# Patient Record
Sex: Male | Born: 1980 | Race: Black or African American | Hispanic: No | Marital: Single | Smoking: Current every day smoker
Health system: Southern US, Community
[De-identification: ages and names within clinical notes are randomized; demographics above are authoritative.]

## PROBLEM LIST (undated history)

## (undated) DIAGNOSIS — R569 Unspecified convulsions: Secondary | ICD-10-CM

## (undated) DIAGNOSIS — N289 Disorder of kidney and ureter, unspecified: Secondary | ICD-10-CM

## (undated) DIAGNOSIS — I10 Essential (primary) hypertension: Secondary | ICD-10-CM

---

## 2005-05-19 ENCOUNTER — Emergency Department (HOSPITAL_COMMUNITY): Admission: EM | Admit: 2005-05-19 | Discharge: 2005-05-19 | Payer: Self-pay | Admitting: Family Medicine

## 2005-09-22 ENCOUNTER — Emergency Department (HOSPITAL_COMMUNITY): Admission: EM | Admit: 2005-09-22 | Discharge: 2005-09-22 | Payer: Self-pay | Admitting: Emergency Medicine

## 2007-04-05 ENCOUNTER — Inpatient Hospital Stay (HOSPITAL_COMMUNITY): Admission: AC | Admit: 2007-04-05 | Discharge: 2007-04-06 | Payer: Self-pay

## 2007-04-21 ENCOUNTER — Ambulatory Visit: Payer: Self-pay | Admitting: Internal Medicine

## 2007-05-05 ENCOUNTER — Ambulatory Visit: Payer: Self-pay | Admitting: Internal Medicine

## 2007-05-08 ENCOUNTER — Ambulatory Visit: Payer: Self-pay | Admitting: *Deleted

## 2007-05-23 ENCOUNTER — Emergency Department (HOSPITAL_COMMUNITY): Admission: EM | Admit: 2007-05-23 | Discharge: 2007-05-24 | Payer: Self-pay | Admitting: Emergency Medicine

## 2007-08-11 ENCOUNTER — Emergency Department (HOSPITAL_COMMUNITY): Admission: EM | Admit: 2007-08-11 | Discharge: 2007-08-11 | Payer: Self-pay | Admitting: Family Medicine

## 2007-11-05 ENCOUNTER — Emergency Department (HOSPITAL_COMMUNITY): Admission: EM | Admit: 2007-11-05 | Discharge: 2007-11-05 | Payer: Self-pay | Admitting: Emergency Medicine

## 2007-11-09 ENCOUNTER — Ambulatory Visit: Payer: Self-pay | Admitting: Internal Medicine

## 2007-11-14 ENCOUNTER — Emergency Department (HOSPITAL_COMMUNITY): Admission: EM | Admit: 2007-11-14 | Discharge: 2007-11-14 | Payer: Self-pay | Admitting: Emergency Medicine

## 2008-01-19 ENCOUNTER — Ambulatory Visit: Payer: Self-pay | Admitting: Internal Medicine

## 2008-01-20 ENCOUNTER — Emergency Department (HOSPITAL_COMMUNITY): Admission: EM | Admit: 2008-01-20 | Discharge: 2008-01-20 | Payer: Self-pay | Admitting: Family Medicine

## 2008-04-08 ENCOUNTER — Ambulatory Visit: Payer: Self-pay | Admitting: Internal Medicine

## 2008-04-30 ENCOUNTER — Emergency Department (HOSPITAL_COMMUNITY): Admission: EM | Admit: 2008-04-30 | Discharge: 2008-04-30 | Payer: Self-pay | Admitting: Family Medicine
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
## 2008-05-14 ENCOUNTER — Emergency Department (HOSPITAL_COMMUNITY): Admission: EM | Admit: 2008-05-14 | Discharge: 2008-05-14 | Payer: Self-pay | Admitting: Emergency Medicine

## 2008-05-15 ENCOUNTER — Ambulatory Visit: Payer: Self-pay | Admitting: Family Medicine

## 2008-07-10 ENCOUNTER — Ambulatory Visit: Payer: Self-pay | Admitting: Internal Medicine

## 2008-08-29 ENCOUNTER — Emergency Department (HOSPITAL_COMMUNITY): Admission: EM | Admit: 2008-08-29 | Discharge: 2008-08-29 | Payer: Self-pay | Admitting: Emergency Medicine

## 2008-09-11 ENCOUNTER — Ambulatory Visit: Payer: Self-pay | Admitting: Internal Medicine

## 2009-07-22 ENCOUNTER — Emergency Department (HOSPITAL_COMMUNITY): Admission: EM | Admit: 2009-07-22 | Discharge: 2009-07-22 | Payer: Self-pay | Admitting: Family Medicine

## 2009-08-08 ENCOUNTER — Emergency Department (HOSPITAL_COMMUNITY): Admission: EM | Admit: 2009-08-08 | Discharge: 2009-08-08 | Payer: Self-pay | Admitting: Emergency Medicine

## 2009-10-20 ENCOUNTER — Ambulatory Visit: Payer: Self-pay | Admitting: Internal Medicine

## 2009-12-15 ENCOUNTER — Ambulatory Visit: Payer: Self-pay | Admitting: Internal Medicine

## 2010-09-06 ENCOUNTER — Emergency Department (HOSPITAL_COMMUNITY): Admission: EM | Admit: 2010-09-06 | Discharge: 2010-09-06 | Payer: Self-pay | Admitting: Emergency Medicine

## 2011-01-17 ENCOUNTER — Encounter: Payer: Self-pay | Admitting: Emergency Medicine

## 2011-01-17 ENCOUNTER — Emergency Department (HOSPITAL_COMMUNITY)
Admission: EM | Admit: 2011-01-17 | Discharge: 2011-01-18 | Payer: Self-pay | Source: Home / Self Care | Admitting: Emergency Medicine

## 2011-04-03 LAB — CBC
HCT: 42 % (ref 39.0–52.0)
Hemoglobin: 14.4 g/dL (ref 13.0–17.0)
MCV: 88.2 fL (ref 78.0–100.0)
Platelets: 167 10*3/uL (ref 150–400)
RDW: 14.4 % (ref 11.5–15.5)

## 2011-04-03 LAB — DIFFERENTIAL
Basophils Absolute: 0.1 10*3/uL (ref 0.0–0.1)
Basophils Relative: 1 % (ref 0–1)
Eosinophils Absolute: 0.3 10*3/uL (ref 0.0–0.7)
Eosinophils Relative: 3 % (ref 0–5)
Monocytes Relative: 11 % (ref 3–12)
Neutrophils Relative %: 62 % (ref 43–77)

## 2011-04-03 LAB — COMPREHENSIVE METABOLIC PANEL
ALT: 21 U/L (ref 0–53)
AST: 21 U/L (ref 0–37)
Albumin: 3.7 g/dL (ref 3.5–5.2)
CO2: 27 mEq/L (ref 19–32)
Chloride: 105 mEq/L (ref 96–112)
Total Bilirubin: 0.9 mg/dL (ref 0.3–1.2)
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
## 2011-09-22 LAB — COMPREHENSIVE METABOLIC PANEL
Albumin: 3.9
Calcium: 9.3
GFR calc Af Amer: >60
GFR calc non Af Amer: 57 — ABNORMAL LOW
Glucose, Bld: 106 — ABNORMAL HIGH
Sodium: 138
Total Protein: 7.3

## 2011-10-05 LAB — DIFFERENTIAL
Basophils Absolute: 0
Lymphocytes Relative: 26
Lymphs Abs: 1.8
Monocytes Relative: 9
Neutrophils Relative %: 60

## 2011-10-05 LAB — COMPREHENSIVE METABOLIC PANEL
ALT: 37
AST: 22
BUN: 4 — ABNORMAL LOW
CO2: 27
Chloride: 107
Creatinine, Ser: 1.34
GFR calc Af Amer: >60
GFR calc non Af Amer: >60
Glucose, Bld: 103 — ABNORMAL HIGH
Potassium: 3.8
Total Bilirubin: 0.7
Total Protein: 6.4

## 2011-10-05 LAB — CBC
HCT: 38.5 — ABNORMAL LOW
MCHC: 33.8
MCV: 86.2

## 2013-05-22 ENCOUNTER — Emergency Department (HOSPITAL_COMMUNITY)
Admission: EM | Admit: 2013-05-22 | Discharge: 2013-05-23 | Disposition: A | Discharge status: Home / Self Care | Payer: Self-pay | Attending: Emergency Medicine | Admitting: Emergency Medicine

## 2013-05-22 ENCOUNTER — Encounter (HOSPITAL_COMMUNITY): Payer: Self-pay | Admitting: *Deleted

## 2013-05-22 DIAGNOSIS — F17200 Nicotine dependence, unspecified, uncomplicated: Secondary | ICD-10-CM | POA: Insufficient documentation

## 2013-05-22 DIAGNOSIS — Z23 Encounter for immunization: Secondary | ICD-10-CM | POA: Insufficient documentation

## 2013-05-22 DIAGNOSIS — Y929 Unspecified place or not applicable: Secondary | ICD-10-CM | POA: Insufficient documentation

## 2013-05-22 DIAGNOSIS — Y9389 Activity, other specified: Secondary | ICD-10-CM | POA: Insufficient documentation

## 2013-05-22 DIAGNOSIS — S61409A Unspecified open wound of unspecified hand, initial encounter: Secondary | ICD-10-CM | POA: Insufficient documentation

## 2013-05-22 DIAGNOSIS — W540XXA Bitten by dog, initial encounter: Secondary | ICD-10-CM

## 2013-05-22 NOTE — ED Notes (Signed)
Pt states was walking down [address] and unknown dog came and bit his right hand; no other injury;

## 2013-05-23 ENCOUNTER — Emergency Department (HOSPITAL_COMMUNITY): Payer: Self-pay

## 2013-05-23 MED ORDER — AMOXICILLIN-POT CLAVULANATE 875-125 MG PO TABS
1.0000 | ORAL_TABLET | Freq: Once | ORAL | Status: AC
  Administered 2013-05-23: 1 via ORAL
  Filled 2013-05-23: qty 1

## 2013-05-23 MED ORDER — AMOXICILLIN-POT CLAVULANATE 875-125 MG PO TABS: 1.0000 | ORAL_TABLET | Freq: Two times a day (BID) | ORAL | Status: DC

## 2013-05-23 MED ORDER — AMOXICILLIN-POT CLAVULANATE 250-62.5 MG/5ML PO SUSR: 250.0000 mg | Freq: Three times a day (TID) | ORAL | Status: DC

## 2013-05-23 MED ORDER — HYDROCODONE-ACETAMINOPHEN 5-325 MG PO TABS: 2.0000 | ORAL_TABLET | Freq: Four times a day (QID) | ORAL | Status: DC | PRN

## 2013-05-23 MED ORDER — RABIES IMMUNE GLOBULIN 150 UNIT/ML IM INJ
20.0000 [IU]/kg | INJECTION | Freq: Once | INTRAMUSCULAR | Status: AC
  Administered 2013-05-23: 2325 [IU] via INTRAMUSCULAR
  Filled 2013-05-23: qty 15.5

## 2013-05-23 MED ORDER — TETANUS-DIPHTH-ACELL PERTUSSIS 5-2.5-18.5 LF-MCG/0.5 IM SUSP
0.5000 mL | Freq: Once | INTRAMUSCULAR | Status: AC
  Administered 2013-05-23: 0.5 mL via INTRAMUSCULAR
  Filled 2013-05-23: qty 0.5

## 2013-05-23 MED ORDER — RABIES VACCINE, PCEC IM SUSR
1.0000 mL | Freq: Once | INTRAMUSCULAR | Status: AC
  Administered 2013-05-23: 1 mL via INTRAMUSCULAR
  Filled 2013-05-23: qty 1

## 2013-05-23 NOTE — ED Provider Notes (Signed)
History     CSN: [id number]  Arrival date & time 05/22/13  2239   First MD Initiated Contact with Patient 05/22/13 2325      Chief Complaint  Patient presents with  . Animal Bite    (Consider location/radiation/quality/duration/timing/severity/associated sxs/prior treatment) HPI Comments: Patient presents emergency department with chief complaint of dog bite. States that the dog is unknown. He is uncertain of doxylamine status. The dog ran off after the bite. The dog bit his right hand. There is no other injury. Patient is complaining of pain and bleeding. There is a small laceration on the palmar aspect of his right hand. His tetanus status is unknown. He has not tried anything to alleviate his symptoms. The pain does not radiate. He denies any weakness in his hand or fingers.  The history is provided by the patient. No language interpreter was used.    History reviewed. No pertinent past medical history.  History reviewed. No pertinent past surgical history.  No family history on file.  History  Substance Use Topics  . Smoking status: Current Every Day Smoker -- 0.50 packs/day    Types: Cigarettes  . Smokeless tobacco: Not on file  . Alcohol Use: No      Review of Systems  All other systems reviewed and are negative.    Allergies  Review of patient's allergies indicates no known allergies.  Home Medications   Current Outpatient Rx  Name  Route  Sig  Dispense  Refill  . amoxicillin-clavulanate (AUGMENTIN) 875-125 MG per tablet   Oral   Take 1 tablet by mouth every 12 (twelve) hours.   14 tablet   0   . HYDROcodone-acetaminophen (NORCO/VICODIN) 5-325 MG per tablet   Oral   Take 2 tablets by mouth every 6 (six) hours as needed for pain.   13 tablet   0     BP 138/93  Pulse 75  Temp(Src) 98.4 F (36.9 C)  Resp 20  Ht 6' (1.829 m)  Wt 260 lb (117.935 kg)  BMI 35.25 kg/m2  SpO2 98%  Physical Exam  Nursing note and vitals reviewed. Constitutional:  He is oriented to person, place, and time. He appears well-developed and well-nourished.  HENT:  Head: Normocephalic and atraumatic.  Eyes: Conjunctivae and EOM are normal.  Neck: Normal range of motion.  Cardiovascular: Normal rate.   Pulmonary/Chest: Effort normal.  Abdominal: He exhibits no distension.  Musculoskeletal: Normal range of motion.  Right hand range of motion 5/5, strength 5/5, no tendon involvement  Neurological: He is alert and oriented to person, place, and time.  Skin: Skin is dry.  5 cm laceration to palmar aspect of right hand from a dog bite, no visible involvement of tendons, no foreign bodies  Psychiatric: He has a normal mood and affect. His behavior is normal. Judgment and thought content normal.    ED Course  Procedures (including critical care time)  Labs Reviewed - No data to display Dg Hand Complete Right  05/23/2013   *RADIOLOGY REPORT*  Clinical Data: Dog bite  RIGHT HAND - COMPLETE 3+ VIEW  Comparison: None.  Findings: No evidence of fracture of the carpal or metacarpal bones.  No foreign body.  IMPRESSION: No fracture or foreign body.   Original Report Authenticated By: Genevive Bi, M.D.   LACERATION REPAIR Performed by: Roxy Horseman Authorized by: Roxy Horseman Consent: Verbal consent obtained. Risks and benefits: risks, benefits and alternatives were discussed Consent given by: patient Patient identity confirmed: provided demographic data Prepped  and Draped in normal sterile fashion Wound explored  Laceration Location:right palm  Laceration Length: 4 cm  No Foreign Bodies seen or palpated  Anesthesia: local infiltration  Local anesthetic: lidocaine 2% without epinephrine  Anesthetic total: 5 ml  Irrigation method: syringe Amount of cleaning: standard  Skin closure: 4-0 nylon  Number of sutures: 2 loose  Technique: simple, loose  Patient tolerance: Patient tolerated the procedure well with no immediate  complications.   1. Dog bite, initial encounter       MDM  Patient with animal bite. He was bitten by a dog earlier today. Will update tetanus, and give rabies prophylaxis. Patient's laceration to right palm was closed with 2 very loose sutures, I will treat the patient with Augmentin and pain medicine. Followup with hand surgery tomorrow. The dog bite was copiously irrigated, no foreign bodies were visualized, no signs of tendon involvement. Patient was seen by and discussed with Dr. Adriana Simas, who agrees with the plan. Patient is stable and ready for discharge.  1:17 AM Patient seen by and discussed with Dr. Adriana Simas.       Roxy Horseman, PA-C 05/23/13 0126

## 2013-05-23 NOTE — ED Provider Notes (Signed)
Medical screening examination/treatment/procedure(s) were conducted as a shared visit with non-physician practitioner(s) and myself.  I personally evaluated the patient during the encounter.   Wound is irrigated copiously and closed very loosely by physician assistant.  Possibility of infection discussed. Antibiotics started.  Donnetta Hutching, MD 05/23/13 [phone]

## 2016-06-10 ENCOUNTER — Encounter (HOSPITAL_COMMUNITY): Payer: Self-pay | Admitting: *Deleted

## 2016-06-10 ENCOUNTER — Emergency Department (HOSPITAL_COMMUNITY)
Admission: EM | Admit: 2016-06-10 | Discharge: 2016-06-10 | Disposition: A | Discharge status: Home / Self Care | Payer: Self-pay | Attending: Emergency Medicine | Admitting: Emergency Medicine

## 2016-06-10 DIAGNOSIS — S01512A Laceration without foreign body of oral cavity, initial encounter: Secondary | ICD-10-CM | POA: Insufficient documentation

## 2016-06-10 DIAGNOSIS — Y929 Unspecified place or not applicable: Secondary | ICD-10-CM | POA: Insufficient documentation

## 2016-06-10 DIAGNOSIS — R569 Unspecified convulsions: Secondary | ICD-10-CM

## 2016-06-10 DIAGNOSIS — N189 Chronic kidney disease, unspecified: Secondary | ICD-10-CM | POA: Insufficient documentation

## 2016-06-10 DIAGNOSIS — G40909 Epilepsy, unspecified, not intractable, without status epilepticus: Secondary | ICD-10-CM | POA: Insufficient documentation

## 2016-06-10 DIAGNOSIS — I129 Hypertensive chronic kidney disease with stage 1 through stage 4 chronic kidney disease, or unspecified chronic kidney disease: Secondary | ICD-10-CM | POA: Insufficient documentation

## 2016-06-10 DIAGNOSIS — Z79899 Other long term (current) drug therapy: Secondary | ICD-10-CM | POA: Insufficient documentation

## 2016-06-10 DIAGNOSIS — X58XXXA Exposure to other specified factors, initial encounter: Secondary | ICD-10-CM | POA: Insufficient documentation

## 2016-06-10 DIAGNOSIS — F17210 Nicotine dependence, cigarettes, uncomplicated: Secondary | ICD-10-CM | POA: Insufficient documentation

## 2016-06-10 DIAGNOSIS — Y939 Activity, unspecified: Secondary | ICD-10-CM | POA: Insufficient documentation

## 2016-06-10 DIAGNOSIS — Y999 Unspecified external cause status: Secondary | ICD-10-CM | POA: Insufficient documentation

## 2016-06-10 HISTORY — DX: Disorder of kidney and ureter, unspecified: N28.9

## 2016-06-10 HISTORY — DX: Unspecified convulsions: R56.9

## 2016-06-10 HISTORY — DX: Essential (primary) hypertension: I10

## 2016-06-10 LAB — BASIC METABOLIC PANEL
ANION GAP: 6 (ref 5–15)
BUN: 12 mg/dL (ref 6–20)
CHLORIDE: 107 mmol/L (ref 101–111)
CO2: 23 mmol/L (ref 22–32)
Calcium: 8.8 mg/dL — ABNORMAL LOW (ref 8.9–10.3)
Creatinine, Ser: 1.49 mg/dL — ABNORMAL HIGH (ref 0.61–1.24)
GFR calc Af Amer: >60 mL/min (ref >60)
GFR, EST NON AFRICAN AMERICAN: 59 mL/min — AB (ref >60)
GLUCOSE: 117 mg/dL — AB (ref 65–99)
POTASSIUM: 4.6 mmol/L (ref 3.5–5.1)
Sodium: 136 mmol/L (ref 135–145)

## 2016-06-10 LAB — CBC WITH DIFFERENTIAL/PLATELET
BASOS PCT: 0 %
Basophils Absolute: 0 10*3/uL (ref 0.0–0.1)
EOS ABS: 0.1 10*3/uL (ref 0.0–0.7)
Eosinophils Relative: 1 %
HCT: 44.1 % (ref 39.0–52.0)
HEMOGLOBIN: 14.7 g/dL (ref 13.0–17.0)
Lymphocytes Relative: 13 %
Lymphs Abs: 1.6 10*3/uL (ref 0.7–4.0)
MCH: 29.1 pg (ref 26.0–34.0)
MCHC: 33.3 g/dL (ref 30.0–36.0)
MCV: 87.2 fL (ref 78.0–100.0)
Monocytes Absolute: 0.9 10*3/uL (ref 0.1–1.0)
Monocytes Relative: 8 %
NEUTROS ABS: 9.3 10*3/uL — AB (ref 1.7–7.7)
NEUTROS PCT: 78 %
PLATELETS: 208 10*3/uL (ref 150–400)
RBC: 5.06 MIL/uL (ref 4.22–5.81)
RDW: 14.2 % (ref 11.5–15.5)
WBC: 11.9 10*3/uL — AB (ref 4.0–10.5)

## 2016-06-10 LAB — CBG MONITORING, ED: Glucose-Capillary: 110 mg/dL — ABNORMAL HIGH (ref 65–99)

## 2016-06-10 MED ORDER — SODIUM CHLORIDE 0.9 % IV BOLUS (SEPSIS)
1000.0000 mL | Freq: Once | INTRAVENOUS | Status: AC
  Administered 2016-06-10: 1000 mL via INTRAVENOUS

## 2016-06-10 MED ORDER — SODIUM CHLORIDE 0.9 % IV BOLUS (SEPSIS): 1000.0000 mL | Freq: Once | INTRAVENOUS | Status: DC

## 2016-06-10 MED ORDER — ACETAMINOPHEN 500 MG PO TABS
1000.0000 mg | ORAL_TABLET | Freq: Once | ORAL | Status: AC
  Administered 2016-06-10: 1000 mg via ORAL
  Filled 2016-06-10: qty 2

## 2016-06-10 MED ORDER — TETANUS-DIPHTH-ACELL PERTUSSIS 5-2.5-18.5 LF-MCG/0.5 IM SUSP
0.5000 mL | Freq: Once | INTRAMUSCULAR | Status: DC
  Filled 2016-06-10: qty 0.5

## 2016-06-10 MED ORDER — LEVETIRACETAM 500 MG/5ML IV SOLN
1000.0000 mg | Freq: Once | INTRAVENOUS | Status: AC
  Administered 2016-06-10: 1000 mg via INTRAVENOUS
  Filled 2016-06-10: qty 10

## 2016-06-10 MED ORDER — CHLORHEXIDINE GLUCONATE 0.12 % MT SOLN: 15.0000 mL | Freq: Two times a day (BID) | OROMUCOSAL | Status: DC

## 2016-06-10 NOTE — ED Notes (Signed)
PA at bedside.

## 2016-06-10 NOTE — ED Notes (Signed)
Per EMS, pt from home, reports pt had a seizure episode while asleep this  Morning.  Post-ictal at present.  Bit his tongue. Takes keppra but missed his dose last night.  [facility]

## 2016-06-10 NOTE — ED Notes (Signed)
Bed: [id number] Expected date:  Expected time:  Means of arrival:  Comments: 25M/seizure/post-ictal

## 2016-06-10 NOTE — Progress Notes (Signed)
Prior to pt d/c ED CM spoke with pt's mother and girlfriend at the bedside  The pt mumbled incoherently when cm attempted to speak with him about his pcp for f/u care Girlfriend offered that [facility] She was not able to recall a specific MD name and stated [facility] had [facility] Girlfriend asked CM if CM knew results of pt's recent [facility] CM informed her that CM did not know and preferrably a MD would share that information with the pt Girlfriend stated she had ask the ED RN - Cm informed her Cm would speak with the ED RN While CM was awaiting the return of the ED RN to the nursing station the Girlfriend found the ED RN and had the pt [facility] in her hand and was inquiring about the d/c dx listed on the sheet as CKD (chronic kidney disease) CM and ED RN referred her back to the pt to discuss his medical issues on his discharge sheet  Girlfriend states she is the one to take care of the pt and is attempting to find out the causes of pt "always having so many seizures"
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
## 2016-06-10 NOTE — Discharge Instructions (Signed)
Have a soft diet over the next couple of days, rinse her mouth out with water after you eat.  Do not take the lisinopril, you can start taking the amlodipine. Is important that you take your Keppra as directed, you can take the next dose tonight.  Do not take any NSAIDs (Goody powder, aspirin, Aleve, Motrin, ibuprofen, naproxen)  Referrals are given for locatable neurologic and family practice care.  Do not hesitate to return to the emergency room for any new, worsening or concerning symptoms.

## 2016-06-10 NOTE — ED Notes (Signed)
PT DISCHARGED. INSTRUCTIONS AND PRESCRIPTION GIVEN. AAOX4. PT IN NO APPARENT DISTRESS OR PAIN. THE OPPORTUNITY TO ASK QUESTIONS WAS PROVIDED. 

## 2016-07-13 ENCOUNTER — Emergency Department (HOSPITAL_COMMUNITY)
Admission: EM | Admit: 2016-07-13 | Discharge: 2016-07-13 | Disposition: A | Discharge status: Home / Self Care | Payer: Self-pay | Attending: Emergency Medicine | Admitting: Emergency Medicine

## 2016-07-13 ENCOUNTER — Encounter (HOSPITAL_COMMUNITY): Payer: Self-pay

## 2016-07-13 DIAGNOSIS — I129 Hypertensive chronic kidney disease with stage 1 through stage 4 chronic kidney disease, or unspecified chronic kidney disease: Secondary | ICD-10-CM | POA: Insufficient documentation

## 2016-07-13 DIAGNOSIS — G40909 Epilepsy, unspecified, not intractable, without status epilepticus: Secondary | ICD-10-CM | POA: Insufficient documentation

## 2016-07-13 DIAGNOSIS — N182 Chronic kidney disease, stage 2 (mild): Secondary | ICD-10-CM | POA: Insufficient documentation

## 2016-07-13 DIAGNOSIS — R569 Unspecified convulsions: Secondary | ICD-10-CM

## 2016-07-13 DIAGNOSIS — F17210 Nicotine dependence, cigarettes, uncomplicated: Secondary | ICD-10-CM | POA: Insufficient documentation

## 2016-07-13 LAB — CBC WITH DIFFERENTIAL/PLATELET
Basophils Absolute: 0 10*3/uL (ref 0.0–0.1)
Basophils Relative: 0 %
Eosinophils Absolute: 0.1 10*3/uL (ref 0.0–0.7)
Eosinophils Relative: 1 %
HEMATOCRIT: 41.9 % (ref 39.0–52.0)
HEMOGLOBIN: 14.2 g/dL (ref 13.0–17.0)
LYMPHS ABS: 1.5 10*3/uL (ref 0.7–4.0)
LYMPHS PCT: 13 %
MCH: 29.5 pg (ref 26.0–34.0)
MCHC: 33.9 g/dL (ref 30.0–36.0)
MCV: 86.9 fL (ref 78.0–100.0)
MONOS PCT: 7 %
Monocytes Absolute: 0.8 10*3/uL (ref 0.1–1.0)
NEUTROS ABS: 9.3 10*3/uL — AB (ref 1.7–7.7)
NEUTROS PCT: 79 %
Platelets: 203 10*3/uL (ref 150–400)
RBC: 4.82 MIL/uL (ref 4.22–5.81)
RDW: 14.4 % (ref 11.5–15.5)
WBC: 11.6 10*3/uL — ABNORMAL HIGH (ref 4.0–10.5)

## 2016-07-13 LAB — BASIC METABOLIC PANEL
ANION GAP: 7 (ref 5–15)
BUN: 9 mg/dL (ref 6–20)
CHLORIDE: 108 mmol/L (ref 101–111)
CO2: 23 mmol/L (ref 22–32)
Calcium: 8.7 mg/dL — ABNORMAL LOW (ref 8.9–10.3)
Creatinine, Ser: 1.34 mg/dL — ABNORMAL HIGH (ref 0.61–1.24)
GFR calc non Af Amer: >60 mL/min (ref >60)
Glucose, Bld: 116 mg/dL — ABNORMAL HIGH (ref 65–99)
POTASSIUM: 4.3 mmol/L (ref 3.5–5.1)
SODIUM: 138 mmol/L (ref 135–145)

## 2016-07-13 LAB — CBG MONITORING, ED: Glucose-Capillary: 127 mg/dL — ABNORMAL HIGH (ref 65–99)

## 2016-07-13 MED ORDER — SODIUM CHLORIDE 0.9 % IV BOLUS (SEPSIS)
1000.0000 mL | Freq: Once | INTRAVENOUS | Status: AC
  Administered 2016-07-13: 1000 mL via INTRAVENOUS

## 2016-07-13 MED ORDER — SODIUM CHLORIDE 0.9 % IV SOLN: 1000.0000 mg | Freq: Once | INTRAVENOUS | Status: DC

## 2016-07-13 MED ORDER — LEVETIRACETAM 500 MG PO TABS: 1000.0000 mg | ORAL_TABLET | Freq: Two times a day (BID) | ORAL | Status: DC

## 2016-07-13 MED ORDER — PROCHLORPERAZINE EDISYLATE 5 MG/ML IJ SOLN
10.0000 mg | Freq: Once | INTRAMUSCULAR | Status: AC
  Administered 2016-07-13: 10 mg via INTRAVENOUS
  Filled 2016-07-13: qty 2

## 2016-07-13 MED ORDER — MORPHINE SULFATE (PF) 4 MG/ML IV SOLN
4.0000 mg | Freq: Once | INTRAVENOUS | Status: AC
  Administered 2016-07-13: 4 mg via INTRAVENOUS
  Filled 2016-07-13: qty 1

## 2016-07-13 NOTE — ED Notes (Addendum)
Pt BIB [facility] c/o 2 witnessed seizures. Witnessed by girlfriend. Girlfriend was unable to comment on the length of the seizures. Upon EMS arrival pt was laying on the tile floor. Post ictal for about 15 minutes. Progressively became more alert en route. Pt has hx of seizures. Currently takes 500mg  of Keppra daily,  Currently A&Ox4. C/o severe headache which pt states is normal for him post-seizure.

## 2016-07-13 NOTE — ED Notes (Signed)
Bed: [id number] Expected date:  Expected time:  Means of arrival:  Comments: 35 yo M  Seizure

## 2016-07-13 NOTE — Discharge Instructions (Signed)
Please follow with your primary care doctor in the next 2 days for a check-up. They must obtain records for further management.   Do not hesitate to return to the Emergency Department for any new, worsening or concerning symptoms.    Seizure, Adult A seizure is abnormal electrical activity in the brain. Seizures usually last from 30 seconds to 2 minutes. There are various types of seizures. Before a seizure, you may have a warning sensation (aura) that a seizure is about to occur. An aura may include the following symptoms:   Fear or anxiety.  Nausea.  Feeling like the room is spinning (vertigo).  Vision changes, such as seeing flashing lights or spots. Common symptoms during a seizure include:  A change in attention or behavior (altered mental status).  Convulsions with rhythmic jerking movements.  Drooling.  Rapid eye movements.  Grunting.  Loss of bladder and bowel control.  Bitter taste in the mouth.  Tongue biting. After a seizure, you may feel confused and sleepy. You may also have an injury resulting from convulsions during the seizure. HOME CARE INSTRUCTIONS   If you are given medicines, take them exactly as prescribed by your health care provider.  Keep all follow-up appointments as directed by your health care provider.  Do not swim or drive or engage in risky activity during which a seizure could cause further injury to you or others until your health care provider says it is OK.  Get adequate rest.  Teach friends and family what to do if you have a seizure. They should:  Lay you on the ground to prevent a fall.  Put a cushion under your head.  Loosen any tight clothing around your neck.  Turn you on your side. If vomiting occurs, this helps keep your airway clear.  Stay with you until you recover.  Know whether or not you need emergency care. SEEK IMMEDIATE MEDICAL CARE IF:  The seizure lasts longer than 5 minutes.  The seizure is severe or you  do not wake up immediately after the seizure.  You have an altered mental status after the seizure.  You are having more frequent or worsening seizures. Someone should drive you to the emergency department or call local emergency services (911 in [place] MAKE SURE YOU:  Understand these instructions.  Will watch your condition.  Will get help right away if you are not doing well or get worse.   This information is not intended to replace advice given to you by your health care provider. Make sure you discuss any questions you have with your health care provider.   Document Released: 12/10/2000 Document Revised: 01/03/2015 Document Reviewed: 07/25/2013 Elsevier Interactive Patient Education [employer]2016 [employer]

## 2016-07-13 NOTE — ED Provider Notes (Signed)
CSN: [id number]     Arrival date & time 07/13/16  4098 History   First MD Initiated Contact with Patient 07/13/16 0654     Chief Complaint  Patient presents with  . Seizures     (Consider location/radiation/quality/duration/timing/severity/associated sxs/prior Treatment) HPI   Blood pressure 172/130, pulse 70, temperature 98 F (36.7 C), temperature source Oral, resp. rate 18, SpO2 96 %.  Tyler Webb is a 35 y.o. male brought in by EMS complaining of 2 witnessed seizures this morning by girlfriend. Upon EMS arrival patient was on the floor and initially postictal for 15 minutes was resolved and route. Patient states he's been compliant with his Keppra 500 twice a day. Patient denies any change in his normal health her daily habits he specifically denies fever, chills, chest pain, abdominal pain, nausea, vomiting states she's been sleeping normally and has not been under any unusual stress. Patient reports severe diffuse headache which is normal for him after seizure.  His girlfriend says that he had to 3 seizures back-to-back lasting approximately 2-3 minutes, tonic-clonic in nature. States that his primary care is in [place] and follows with neurology at [facility].  Past Medical History  Diagnosis Date  . Seizures (HCC)   . Hypertension   . Renal disorder     chronic kidney disease - stage 2   History reviewed. No pertinent past surgical history. History reviewed. No pertinent family history. Social History  Substance Use Topics  . Smoking status: Current Every Day Smoker -- 0.50 packs/day    Types: Cigarettes  . Smokeless tobacco: None  . Alcohol Use: Yes     Comment: occ    Review of Systems  10 systems reviewed and found to be negative, except as noted in the HPI.   Allergies  Review of patient's allergies indicates no known allergies.  Home Medications   Prior to Admission medications   Medication Sig Start Date End Date Taking? Authorizing Provider  chlorhexidine  (PERIDEX) 0.12 % solution Use as directed 15 mLs in the mouth or throat 2 (two) times daily. Patient not taking: Reported on 07/13/2016 06/10/16   Joni Reining Finnley Lewis, PA-C  levETIRAcetam (KEPPRA) 500 MG tablet Take 2 tablets (1,000 mg total) by mouth 2 (two) times daily. 07/13/16   Braden Cimo, PA-C   BP 139/86 mmHg  Pulse 55  Temp(Src) 98.4 F (36.9 C) (Oral)  Resp 18  SpO2 100% Physical Exam  Constitutional: He is oriented to person, place, and time. He appears well-developed and well-nourished. No distress.  HENT:  Head: Normocephalic and atraumatic.  Mouth/Throat: Oropharynx is clear and moist.  No intraoral trauma, remote large tongue laceration on the left posterior aspect which is well-healed  Eyes: Conjunctivae and EOM are normal. Pupils are equal, round, and reactive to light.  Neck: Normal range of motion. Neck supple.  No midline C-spine  tenderness to palpation or step-offs appreciated. Patient has full range of motion without pain.  Grip strength, biceps, triceps 5/5 bilaterally;  can differentiate between pinprick and light touch bilaterally.   Cardiovascular: Normal rate and regular rhythm.   Pulmonary/Chest: Effort normal and breath sounds normal. No stridor. No respiratory distress. He has no wheezes. He has no rales. He exhibits no tenderness.  Abdominal: Soft. Bowel sounds are normal.  Musculoskeletal: Normal range of motion.  Neurological: He is alert and oriented to person, place, and time.  Follows commands, Clear, goal oriented speech, Strength is 5 out of 5x4 extremities, patient ambulates with a coordinated in nonantalgic gait. Sensation  is grossly intact.   Psychiatric: He has a normal mood and affect.  Nursing note and vitals reviewed.   ED Course  Procedures (including critical care time) Labs Review Labs Reviewed  BASIC METABOLIC PANEL - Abnormal; Notable for the following:    Glucose, Bld 116 (*)    Creatinine, Ser 1.34 (*)    Calcium 8.7 (*)     All other components within normal limits  CBC WITH DIFFERENTIAL/PLATELET - Abnormal; Notable for the following:    WBC 11.6 (*)    Neutro Abs 9.3 (*)    All other components within normal limits  CBG MONITORING, ED - Abnormal; Notable for the following:    Glucose-Capillary 127 (*)    All other components within normal limits  CBC WITH DIFFERENTIAL/PLATELET  CBG MONITORING, ED    Imaging Review No results found. I have personally reviewed and evaluated these images and lab results as part of my medical decision-making.   EKG Interpretation None      MDM   Final diagnoses:  Seizure (HCC)    Filed Vitals:   07/13/16 1030 07/13/16 1100 07/13/16 1130 07/13/16 1200  BP: 130/89 132/84 123/94 139/86  Pulse: 59 66 55   Temp:      TempSrc:      Resp: 18 15 18 18   SpO2: 100% 100% 100%     Medications  sodium chloride 0.9 % bolus 1,000 mL (0 mLs Intravenous Stopped 07/13/16 0836)  prochlorperazine (COMPAZINE) injection 10 mg (10 mg Intravenous Given 07/13/16 0717)  morphine 4 MG/ML injection 4 mg (4 mg Intravenous Given 07/13/16 0834)  sodium chloride 0.9 % bolus 1,000 mL (0 mLs Intravenous Stopped 07/13/16 1204)    Tyler Webb is 35 y.o. male presenting with 2 seizures this morning, was postictal for EMS, he's been compliant with his Keppra 500 twice a day.  Creatinine is elevated at 1.34 however that was improved from his last visit one month ago. In discussion with his mother and girlfriend patient has not been able to get into see [facility] neurolog[facility], he has an appointment in mid August, unfortunately, this patient's care [place] in [place] he used to live, I think it would be much easier for him care. Case management consult initiated. Although case management could not provide much guidance this patient's mother has arranged for him to be seen at the internal medicine teaching service tomorrow. I think that would be very good if this patient could get local care  in the orange card. I will increase his Keppra to 1000 twice a day.  Evaluation does not show pathology that would require ongoing emergent intervention or inpatient treatment. Pt is hemodynamically stable and mentating appropriately. Discussed findings and plan with patient/guardian, who agrees with care plan. All questions answered. Return precautions discussed and outpatient follow up given.     Wynetta Emeryicole Samanyu Tinnell, PA-C 07/13/16 1439   Arby BarretteMarcy Pfeiffer, MD 07/27/16 1616

## 2016-07-13 NOTE — Progress Notes (Addendum)
CM spoke with pt to confirm Cm had permission to speak with/answer questions from his mother and girlfriend at the bedside who confirms [place] pcp. [place] family planning medicaid over a year ago but then [place] the [place] family planning of [place] [facility] [place] still available Confirmed address and number in EPIC Initially girlfriend wanted CM to change [place] - Cm informed her Cm unable to do that Referred her to [place] DSS office Discussed what family planning medicaid did and did not cover Girlfriend states EDP recommends a neurologist Cm discussed as self pay/family planning medicaid the pt specialist is would not be able to guarantee a discount on cost of care and may start at $250 ED RN present hanging IV during part of the assessment  CM discussed and provided written information to assist pt with determining choice for uninsured accepting pcps, discussed the importance of pcp vs EDP services for f/u care, www.needymeds.org, www.goodrx.com, discounted pharmacies and other [place] resources such as [facility] , [facility], affordable care act, financial assistance, uninsured dental services, [place] med assist, DSS and  health department  Reviewed resources for [place] uninsured accepting pcps like [facility], family medicine at [address], [facility], [facility], local urgent care centers, [facility], [facility] family practice, general medical clinics, family services of the [facility] [facility], [facility] urgent care plus others, medication resources, [facility] out patient pharmacies and housing Girlfriend and mother voiced understanding and appreciation of resources provided   Provided [facility] contact information Pt agreed to a referral Cm completed referral = Not sure if pt eligible but offer provided   Pt to be contact by [facility] clinical liaison  Mother with an orange card 2-3 years ago attempting to provided directions for application to girlfriend but incorrect information Cm provided/discussed correct information. Mother left the room stating she had to leave "because I'm not stupid"  CM discussed that the [facility] processes changed since 2-3 yrs ago  Discussed [place] housing information when confirmed with mother that pt needed housing vs facility "I need to find a place to put him"   ED RN updated when Cm completed consult

## 2016-07-13 NOTE — ED Notes (Signed)
Recollect of BMP and CBC sent to lab.

## 2016-07-13 NOTE — ED Notes (Signed)
PA at bedside.

## 2016-07-14 ENCOUNTER — Ambulatory Visit (INDEPENDENT_AMBULATORY_CARE_PROVIDER_SITE_OTHER): Payer: Self-pay | Admitting: Internal Medicine

## 2016-07-14 VITALS — BP 145/81 | HR 82 | Temp 98.5°F | Ht 72.0 in | Wt 257.9 lb

## 2016-07-14 DIAGNOSIS — G40909 Epilepsy, unspecified, not intractable, without status epilepticus: Secondary | ICD-10-CM

## 2016-07-14 DIAGNOSIS — E669 Obesity, unspecified: Secondary | ICD-10-CM | POA: Insufficient documentation

## 2016-07-14 DIAGNOSIS — I10 Essential (primary) hypertension: Secondary | ICD-10-CM

## 2016-07-14 NOTE — Patient Instructions (Signed)
For your seizures, please continue taking Keppra 1000mg  twice daily.  Things that can cause seizures include  -Injury -Stroke -Drugs: alcohol, marijuana, cocaine -Tumors  For your blood pressure, please continue taking amlodipine 5mg  and lisinopril 10mg .

## 2016-07-14 NOTE — Assessment & Plan Note (Signed)
Assessment His blood pressure today is 145/81. Home medications include amlodipine 5 mg and lisinopril 10 mg. He was told to hold these medications last night in the emergency department given "stage I kidney disease."  Review of the original chart shows elevated creatinine 1.3 which is likely in the setting of his muscular body habitus.  Plan -Resuming amlodipine 5 mg and lisinopril 10 mg daily

## 2016-07-14 NOTE — Progress Notes (Deleted)
Patient ID: Tyler Webb, male   DOB: 06/08/1981, 35 y.o.   MRN: [id number]

## 2016-07-14 NOTE — Progress Notes (Signed)
   Subjective:    Patient ID: Tyler Webb, male    DOB: 07/28/1981, 35 y.o.   MRN: [id number]  HPI Tyler Webb is a 35 year old male who presents today with his male partner to establish care with our clinic. Please see assessment & plan for status of chronic medical problems.  No past surgical history on file. Family History  Problem Relation Age of Onset  . Diabetes Mother   . Kidney failure Mother     Dialysis  . Cancer Father    Social History   Social History  . Marital Status: Single    Spouse Name: N/A  . Number of Children: N/A  . Years of Education: N/A   Occupational History  . Not on file.   Social History Main Topics  . Smoking status: Current Every Day Smoker -- 0.30 packs/day    Types: Cigarettes  . Smokeless tobacco: Not on file     Comment: 2-3 packs/day  . Alcohol Use: 0.0 oz/week    0 Standard drinks or equivalent per week     Comment: 1 can of beer on occassion  . Drug Use: No  . Sexual Activity: Yes   Other Topics Concern  . 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 He appears well-developed and well-nourished.  HENT:  Head: Normocephalic and atraumatic.  Eyes: Conjunctivae are normal. No scleral icterus.  Cardiovascular: Normal rate and regular rhythm.   Pulmonary/Chest: Effort normal. No respiratory distress.  Abdominal: Soft. Bowel sounds are normal.  Musculoskeletal: Normal range of motion. He exhibits no tenderness.  Bilateral upper and lower extremity strength 5/5.  Neurological: He is alert and oriented to person, place, and time.  Skin: Skin is warm and dry.          Assessment & Plan:

## 2016-07-14 NOTE — Assessment & Plan Note (Addendum)
Assessment His partner reports that over the last year, he has developed a seizure disorder. His seizures occur monthly and accompanied by generalized shaking and tongue biting but no bowel/bladder incontinence. Most recently, he had one yesterday for which she called EMS to have him transported to the hospital. Records are available through the duplicated chart, and he was prescribed Keppra 1000 mg twice daily with instructions to follow up here today to establish care.  He has [facility] with [facility] who started him on Keppra 500 mg twice daily but has not been to see them back for some time. Seizures occur while he is sleeping, and he is unable to identify any precipitating factors, like right lights, sounds. He denies prior history of strokes, family history of seizures, excessive alcohol intake, history of alcohol withdrawal, ongoing illicit substance abuse [also in the absence of his partner].  Upon later review, records are available to care everywhere through a duplicate chart with notable workup. He was last seen by neurology on 05/27/15 at which time the etiology was assessed as possibly ongoing polysubstance abuse [UDS 08/07/14 was notable for cocaine, marijuana]. Brain MRI was ordered at that time and was reassuring for no anatomic or ischemic changes. He was also instructed not to drive for at least 6 months following his last seizure-like activity.  Plan -Continue Keppra 1000 mg twice daily -Counseled patient on avoiding known precipitants of seizures, like marijuana, cocaine, excessive alcohol use -Encouraged him to complete orange card paperwork so that we may facilitate his referral to a neurologist here locally within our network

## 2016-07-15 NOTE — Progress Notes (Signed)
Internal Medicine Clinic Attending  Case discussed with Dr. Patel,Rushil at the time of the visit.  We reviewed the resident's history and exam and pertinent patient test results.  I agree with the assessment, diagnosis, and plan of care documented in the resident's note.  

## 2016-07-19 ENCOUNTER — Encounter (HOSPITAL_COMMUNITY): Payer: Self-pay | Admitting: Emergency Medicine

## 2016-07-19 ENCOUNTER — Emergency Department (HOSPITAL_COMMUNITY)
Admission: EM | Admit: 2016-07-19 | Discharge: 2016-07-19 | Disposition: A | Discharge status: Home / Self Care | Payer: Self-pay | Attending: Emergency Medicine | Admitting: Emergency Medicine

## 2016-07-19 DIAGNOSIS — R569 Unspecified convulsions: Secondary | ICD-10-CM

## 2016-07-19 DIAGNOSIS — F17210 Nicotine dependence, cigarettes, uncomplicated: Secondary | ICD-10-CM | POA: Insufficient documentation

## 2016-07-19 DIAGNOSIS — G40909 Epilepsy, unspecified, not intractable, without status epilepticus: Secondary | ICD-10-CM | POA: Insufficient documentation

## 2016-07-19 DIAGNOSIS — Z79899 Other long term (current) drug therapy: Secondary | ICD-10-CM | POA: Insufficient documentation

## 2016-07-19 DIAGNOSIS — F1910 Other psychoactive substance abuse, uncomplicated: Secondary | ICD-10-CM | POA: Insufficient documentation

## 2016-07-19 DIAGNOSIS — I10 Essential (primary) hypertension: Secondary | ICD-10-CM | POA: Insufficient documentation

## 2016-07-19 LAB — CBC WITH DIFFERENTIAL/PLATELET
BASOS ABS: 0 10*3/uL (ref 0.0–0.1)
BASOS PCT: 0 %
EOS PCT: 1 %
Eosinophils Absolute: 0.1 10*3/uL (ref 0.0–0.7)
HCT: 46.4 % (ref 39.0–52.0)
Hemoglobin: 15.6 g/dL (ref 13.0–17.0)
Lymphocytes Relative: 14 %
Lymphs Abs: 1.7 10*3/uL (ref 0.7–4.0)
MCH: 29.3 pg (ref 26.0–34.0)
MCHC: 33.6 g/dL (ref 30.0–36.0)
MCV: 87.1 fL (ref 78.0–100.0)
MONO ABS: 0.6 10*3/uL (ref 0.1–1.0)
Monocytes Relative: 5 %
NEUTROS ABS: 9.5 10*3/uL — AB (ref 1.7–7.7)
Neutrophils Relative %: 80 %
PLATELETS: 236 10*3/uL (ref 150–400)
RBC: 5.33 MIL/uL (ref 4.22–5.81)
RDW: 14.5 % (ref 11.5–15.5)
WBC: 11.9 10*3/uL — AB (ref 4.0–10.5)

## 2016-07-19 LAB — BASIC METABOLIC PANEL
ANION GAP: 7 (ref 5–15)
BUN: 8 mg/dL (ref 6–20)
CALCIUM: 9.4 mg/dL (ref 8.9–10.3)
CO2: 28 mmol/L (ref 22–32)
Chloride: 103 mmol/L (ref 101–111)
Creatinine, Ser: 1.49 mg/dL — ABNORMAL HIGH (ref 0.61–1.24)
GFR calc Af Amer: >60 mL/min (ref >60)
GFR, EST NON AFRICAN AMERICAN: 59 mL/min — AB (ref >60)
GLUCOSE: 108 mg/dL — AB (ref 65–99)
Potassium: 4.1 mmol/L (ref 3.5–5.1)
SODIUM: 138 mmol/L (ref 135–145)

## 2016-07-19 LAB — RAPID URINE DRUG SCREEN, HOSP PERFORMED
Amphetamines: NOT DETECTED
BENZODIAZEPINES: NOT DETECTED
Barbiturates: NOT DETECTED
COCAINE: POSITIVE — AB
Opiates: NOT DETECTED
Tetrahydrocannabinol: POSITIVE — AB

## 2016-07-19 LAB — CBG MONITORING, ED: GLUCOSE-CAPILLARY: 117 mg/dL — AB (ref 65–99)

## 2016-07-19 MED ORDER — SODIUM CHLORIDE 0.9 % IV SOLN
1000.0000 mg | Freq: Once | INTRAVENOUS | Status: AC
  Administered 2016-07-19: 1000 mg via INTRAVENOUS
  Filled 2016-07-19: qty 10

## 2016-07-19 NOTE — ED Notes (Signed)
PT DISCHARGED. INSTRUCTIONS GIVEN. AAOX4. PT IN NO APPARENT DISTRESS OR PAIN. THE OPPORTUNITY TO ASK QUESTIONS WAS PROVIDED. 

## 2016-07-19 NOTE — ED Notes (Signed)
Bed: WA14 Expected date:  Expected time:  Means of arrival:  Comments: EMS seizure 

## 2016-07-19 NOTE — ED Triage Notes (Addendum)
Per EMS, Pt from home, pt is a noncompliant seizure patient who had a seizure at home today, witnessed by daughter. A&Ox4. Pt didn't want to come to hospital, mother was insistent. Denies oral trauma, and urinary incontinence. Pt a little unsteady on his feet. Possible marijuana use, pt smells like marijuana.Pt was not postictal for EMS.

## 2016-07-19 NOTE — ED Provider Notes (Signed)
Patient reportedly had seizure earlier today. He denies noncompliance with medication. He is presently asymptomatic except feels "tired" . He admits using cocaine. Last time one week ago. On exam alert Glasgow Coma Score 15 moves all extremities well.   Doug Sou, MD 07/19/16 1324

## 2016-07-19 NOTE — ED Provider Notes (Signed)
[facility]-EMERGENCY DEPT Provider Note   CSN: [id number] Arrival date & time: 07/19/16  1142  First Provider Contact:  First MD Initiated Contact with Patient 07/18/16 1245        History   Chief Complaint Chief Complaint  Patient presents with  . Seizures    HPI Tyler Webb is an 35 y.o. male with history of seizures and polysubstance abuse who presents to the ED for evaluation after a seizure. He was sleeping when his niece witnessed him shaking all over and he would not respond for several minutes. He states this is typical of his seizures and they usually occur while he is sleeping. He does not remember the incident. He was reportedly not post-ictal on EMS arrival. He states now in the ED he just feels tired. Denies headache, blurred vision, numbness, weakness, tingling. Denies abd pain, n/v/d. He does admit to using cocaine last week and marijuana daily. He states he has been taking his keppra as prescribed. He was seen in the ED last week for similar episode and was told to increase his keppra to 1000mg  BID which he states he has been compliant with. He states he has not been able to see neuro as an outpatient due to cost.  Past Medical History:  Diagnosis Date  . Hypertension   . Renal disorder    chronic kidney disease - stage 2  . Seizures (HCC)     There are no active problems to display for this patient.   History reviewed. No pertinent surgical history.     Home Medications    Prior to Admission medications   Medication Sig Start Date End Date Taking? Authorizing Provider  amLODipine (NORVASC) 10 MG tablet Take 10 mg by mouth daily. 06/07/16  Yes Historical Provider, MD  levETIRAcetam (KEPPRA) 500 MG tablet Take 2 tablets (1,000 mg total) by mouth 2 (two) times daily. 07/13/16  Yes Nicole Pisciotta, PA-C  chlorhexidine (PERIDEX) 0.12 % solution Use as directed 15 mLs in the mouth or throat 2 (two) times daily. Patient not taking: Reported on 07/13/2016 06/10/16   Wynetta Emery, PA-C    Family History No family history on file.  Social History Social History  Substance Use Topics  . Smoking status: Current Every Day Smoker    Packs/day: 0.50    Types: Cigarettes  . Smokeless tobacco: Not on file  . Alcohol use Yes     Comment: occ     Allergies   Review of patient's allergies indicates no known allergies.   Review of Systems Review of Systems 10 Systems reviewed and are negative for acute change except as noted in the HPI.   Physical Exam Updated Vital Signs BP 156/95 (BP Location: Left Arm)   Pulse (!) 56   Temp 98.2 F (36.8 C) (Oral)   Resp 25   Ht 6' (1.829 m)   Wt 116.6 kg   SpO2 95%   BMI 34.86 kg/m   Physical Exam  Constitutional: He is oriented to person, place, and time. No distress.  HENT:  Head: Atraumatic.  Right Ear: External ear normal.  Left Ear: External ear normal.  Nose: Nose normal.  Eyes: Conjunctivae are normal. No scleral icterus.  Cardiovascular: Normal rate and regular rhythm.   Pulmonary/Chest: Effort normal. No respiratory distress.  Abdominal: He exhibits no distension.  Neurological: He is alert and oriented to person, place, and time. He has normal reflexes. No cranial nerve deficit. Coordination normal.  Steady gait Strength intact throughout  Skin: Skin is warm and dry. He is not diaphoretic.  Psychiatric: He has a normal mood and affect. His behavior is normal.  Nursing note and vitals reviewed.    ED Treatments / Results  Labs (all labs ordered are listed, but only abnormal results are displayed) Labs Reviewed  BASIC METABOLIC PANEL - Abnormal; Notable for the following:       Result Value   Glucose, Bld 108 (*)    Creatinine, Ser 1.49 (*)    GFR calc non Af Amer 59 (*)    All other components within normal limits  CBC WITH DIFFERENTIAL/PLATELET - Abnormal; Notable for the following:    WBC 11.9 (*)    Neutro Abs 9.5 (*)    All other components within normal limits  URINE  RAPID DRUG SCREEN, HOSP PERFORMED - Abnormal; Notable for the following:    Cocaine POSITIVE (*)    Tetrahydrocannabinol POSITIVE (*)    All other components within normal limits  CBG MONITORING, ED - Abnormal; Notable for the following:    Glucose-Capillary 117 (*)    All other components within normal limits    EKG  EKG Interpretation  Date/Time:  Monday July 19 2016 11:58:16 EDT Ventricular Rate:  69 PR Interval:    QRS Duration: 87 QT Interval:  364 QTC Calculation: 390 R Axis:   33 Text Interpretation:  Sinus rhythm Borderline repolarization abnormality No significant change since last tracing Confirmed by KOHUT  MD, STEPHEN ([id number]) on 07/19/2016 1:29:35 PM       Radiology No results found.  Procedures Procedures (including critical care time)  Medications Ordered in ED Medications  levETIRAcetam (KEPPRA) 1,000 mg in sodium chloride 0.9 % 100 mL IVPB (1,000 mg Intravenous New Bag/Given 07/19/16 1342)     Initial Impression / Assessment and Plan / ED Course  I have reviewed the triage vital signs and the nursing notes.  Pertinent labs & imaging results that were available during my care of the patient were reviewed by me and considered in my medical decision making (see chart for details).  UDS positive for THC and cocaine. Otherwise labs unremarkable. Exam nonfocal. Loaded with dose of keppra in the ED. I consulted neurology for med recs and, as expected, difficult to make recommendations with pt continuing to use drugs. No changes for now. Stop using drugs. I had a frank discussion with pt about his drug use and persistent seizures. I highly encouraged him to stop using cocaine, THC, and all other illicit drugs otherwise he will likely continue to have seizures. Instructed f/u with ortho when financially feasible.  Final Clinical Impressions(s) / ED Diagnoses   Final diagnoses:  Seizure [facility])  Polysubstance abuse    New Prescriptions Discharge Medication List as  of 07/19/2016  3:43 PM       Carlene Coria, PA-C 07/20/16 2052    Doug Sou, MD 07/23/16 1735

## 2016-07-19 NOTE — Discharge Instructions (Signed)
STOP using all illicit drugs. This is probably why you are getting seizures so frequently despite taking your medication. Continue keppra 1000mg  twice a day. When you are financially able please follow up with either [place] or [facility] neurology for neurology evaluation. Return to the ER for new or worsening symptoms.

## 2016-10-04 ENCOUNTER — Emergency Department (HOSPITAL_COMMUNITY)
Admission: EM | Admit: 2016-10-04 | Discharge: 2016-10-04 | Disposition: A | Discharge status: Home / Self Care | Payer: Self-pay | Attending: Emergency Medicine | Admitting: Emergency Medicine

## 2016-10-04 ENCOUNTER — Encounter (HOSPITAL_COMMUNITY): Payer: Self-pay | Admitting: Emergency Medicine

## 2016-10-04 DIAGNOSIS — I129 Hypertensive chronic kidney disease with stage 1 through stage 4 chronic kidney disease, or unspecified chronic kidney disease: Secondary | ICD-10-CM | POA: Insufficient documentation

## 2016-10-04 DIAGNOSIS — Y999 Unspecified external cause status: Secondary | ICD-10-CM | POA: Insufficient documentation

## 2016-10-04 DIAGNOSIS — Y929 Unspecified place or not applicable: Secondary | ICD-10-CM | POA: Insufficient documentation

## 2016-10-04 DIAGNOSIS — F17210 Nicotine dependence, cigarettes, uncomplicated: Secondary | ICD-10-CM | POA: Insufficient documentation

## 2016-10-04 DIAGNOSIS — Y939 Activity, unspecified: Secondary | ICD-10-CM | POA: Insufficient documentation

## 2016-10-04 DIAGNOSIS — G40909 Epilepsy, unspecified, not intractable, without status epilepticus: Secondary | ICD-10-CM | POA: Insufficient documentation

## 2016-10-04 DIAGNOSIS — X58XXXA Exposure to other specified factors, initial encounter: Secondary | ICD-10-CM | POA: Insufficient documentation

## 2016-10-04 DIAGNOSIS — N182 Chronic kidney disease, stage 2 (mild): Secondary | ICD-10-CM | POA: Insufficient documentation

## 2016-10-04 DIAGNOSIS — S01512A Laceration without foreign body of oral cavity, initial encounter: Secondary | ICD-10-CM

## 2016-10-04 DIAGNOSIS — R569 Unspecified convulsions: Secondary | ICD-10-CM

## 2016-10-04 DIAGNOSIS — S01522A Laceration with foreign body of oral cavity, initial encounter: Secondary | ICD-10-CM | POA: Insufficient documentation

## 2016-10-04 DIAGNOSIS — Z79899 Other long term (current) drug therapy: Secondary | ICD-10-CM | POA: Insufficient documentation

## 2016-10-04 LAB — BASIC METABOLIC PANEL
Anion gap: 8 (ref 5–15)
BUN: 14 mg/dL (ref 6–20)
CO2: 21 mmol/L — ABNORMAL LOW (ref 22–32)
Calcium: 9.3 mg/dL (ref 8.9–10.3)
Chloride: 105 mmol/L (ref 101–111)
Creatinine, Ser: 1.5 mg/dL — ABNORMAL HIGH (ref 0.61–1.24)
GFR calc Af Amer: >60 mL/min (ref >60)
GFR calc non Af Amer: 58 mL/min — ABNORMAL LOW (ref >60)
Glucose, Bld: 100 mg/dL — ABNORMAL HIGH (ref 65–99)
Potassium: 4.4 mmol/L (ref 3.5–5.1)
Sodium: 134 mmol/L — ABNORMAL LOW (ref 135–145)

## 2016-10-04 LAB — CBG MONITORING, ED: Glucose-Capillary: 134 mg/dL — ABNORMAL HIGH (ref 65–99)

## 2016-10-04 LAB — CBC
HCT: 47.9 % (ref 39.0–52.0)
Hemoglobin: 16.3 g/dL (ref 13.0–17.0)
MCH: 29.6 pg (ref 26.0–34.0)
MCHC: 34 g/dL (ref 30.0–36.0)
MCV: 86.9 fL (ref 78.0–100.0)
Platelets: 211 10*3/uL (ref 150–400)
RBC: 5.51 MIL/uL (ref 4.22–5.81)
RDW: 13.9 % (ref 11.5–15.5)
WBC: 17.3 10*3/uL — ABNORMAL HIGH (ref 4.0–10.5)

## 2016-10-04 MED ORDER — SODIUM CHLORIDE 0.9 % IV SOLN
1000.0000 mg | Freq: Once | INTRAVENOUS | Status: AC
  Administered 2016-10-04: 1000 mg via INTRAVENOUS
  Filled 2016-10-04: qty 10

## 2016-10-04 MED ORDER — MORPHINE SULFATE (PF) 4 MG/ML IV SOLN
6.0000 mg | Freq: Once | INTRAVENOUS | Status: AC
  Administered 2016-10-04: 6 mg via INTRAVENOUS
  Filled 2016-10-04: qty 2

## 2016-10-04 NOTE — ED Provider Notes (Signed)
[facility]-EMERGENCY DEPT Provider Note   CSN: [id number] Arrival date & time: 10/04/16  1105  By signing my name below, I, Tyler Webb, attest that this documentation has been prepared under the direction and in the presence of Tyler Razor, MD. Electronically Signed: Rosario Webb, ED Scribe. 10/04/16. 11:42 AM.  History   Chief Complaint Chief Complaint  Patient presents with  . Seizures   The history is provided by the patient, the EMS personnel and a significant other. No language interpreter was used.    HPI Comments: Tyler Webb is a 35 y.o. male BIB EMS, with a PMHx of seizure disorder and substance abuse, who presents to the Emergency Department s/p witnessed, full body seizure-like activity that occurred just PTA. Significant other at bedside reports that during this episode the pt seemed to be awake which is different from his previous seizures, and that he was repeatedly striking his head on a door that was near him during the activity. She additionally notes that blood was coming from his mouth and that he may have bit his tongue during the incident. Pt reports that he is in moderate pain to an area where he repeatedly struck his head over his left eye, has a diffuse headache, and is in pain over his upper, left-sided chest wall from falling during his seizure. Per prior chart review, pt was last seen in the ED on 07/19/16 (approximately ~2.5 months ago) after his most recent seizure. At that time his workup of basic labs were otherwise unremarkable. Per girlfriend, pt was also seen for another seizure prior to this where his dosage of Keppra was increased to 1000mg  BID. Pt has been compliant with this medication as prescribed since. She further that he has an appointment to see his Neurologist again in ~1 month. He states that he had slept well last night prior to the onset of his seizure. Denies photophobia, visual disturbance, or any other associated symptoms.    Neurologist: Claretha Cooper., MD [facility]   Past Medical History:  Diagnosis Date  . Hypertension   . Renal disorder    chronic kidney disease - stage 2  . Seizures [facility])    Patient Active Problem List   Diagnosis Date Noted  . Hypertension 07/14/2016  . Seizure disorder (HCC) 07/14/2016  . Obesity 07/14/2016   History reviewed. No pertinent surgical history.  Home Medications    Prior to Admission medications   Medication Sig Start Date End Date Taking? Authorizing Provider  amLODipine (NORVASC) 10 MG tablet Take 10 mg by mouth daily. 06/07/16   Historical Provider, MD  chlorhexidine (PERIDEX) 0.12 % solution Use as directed 15 mLs in the mouth or throat 2 (two) times daily. Patient not taking: Reported on 07/13/2016 06/10/16   Joni Reining Pisciotta, PA-C  levETIRAcetam (KEPPRA) 500 MG tablet Take 2 tablets (1,000 mg total) by mouth 2 (two) times daily. 07/13/16   Joni Reining Pisciotta, PA-C   Family History Family History  Problem Relation Age of Onset  . Diabetes Mother   . Kidney failure Mother     Dialysis  . Cancer Father    Social History Social History  Substance Use Topics  . Smoking status: Current Every Day Smoker    Packs/day: 0.50    Types: Cigarettes  . Smokeless tobacco: Not on file     Comment: 2-3 packs/day  . Alcohol use Yes     Comment: occ   Allergies   Review of patient's allergies indicates no known allergies.  Review of Systems Review of Systems  Eyes: Negative for photophobia and visual disturbance.  Cardiovascular: Positive for chest pain.  Neurological: Positive for seizures and headaches.  All other systems reviewed and are negative.  Physical Exam Updated Vital Signs BP 135/83   Pulse 82   Temp 98.6 F (37 C) (Oral)   Resp 15   SpO2 96%   Physical Exam  Constitutional: He is oriented to person, place, and time. He appears well-developed and well-nourished. No distress.  HENT:  Head: Normocephalic.  Mouth/Throat: Oropharynx is clear  and moist. No oropharyngeal exudate.  Laceration to the left side of his tongue. No active bleeding on exam. Small laceration to the buccal mucosa of the right cheek.   Eyes: Conjunctivae and EOM are normal. Pupils are equal, round, and reactive to light. Right eye exhibits no discharge. Left eye exhibits no discharge. No scleral icterus.  Neck: Normal range of motion. Neck supple. No JVD present. No thyromegaly present.  Cardiovascular: Normal rate, regular rhythm, normal heart sounds and intact distal pulses.  Exam reveals no gallop and no friction rub.   No murmur heard. Pulmonary/Chest: Effort normal and breath sounds normal. No respiratory distress. He has no wheezes. He has no rales.  Abdominal: Soft. Bowel sounds are normal. He exhibits no distension and no mass. There is no tenderness.  Musculoskeletal: Normal range of motion. He exhibits no edema or tenderness.  Lymphadenopathy:    He has no cervical adenopathy.  Neurological: He is alert and oriented to person, place, and time. He displays normal reflexes. No cranial nerve deficit. He exhibits normal muscle tone. Coordination normal.  Answers question appropriately.   Skin: Skin is warm and dry. No rash noted. No erythema.  Psychiatric: He has a normal mood and affect. His behavior is normal.  Nursing note and vitals reviewed.  ED Treatments / Results  DIAGNOSTIC STUDIES: Oxygen Saturation is 96% on RA, normal by my interpretation.   COORDINATION OF CARE: 11:42 AM-Discussed next steps with pt. Pt verbalized understanding and is agreeable with the plan.   Labs (all labs ordered are listed, but only abnormal results are displayed) Labs Reviewed  CBG MONITORING, ED - Abnormal; Notable for the following:       Result Value   Glucose-Capillary 134 (*)    All other components within normal limits  BASIC METABOLIC PANEL  CBC   EKG  EKG Interpretation None      Radiology No results  found.  Procedures Procedures  Medications Ordered in ED Medications - No data to display  Initial Impression / Assessment and Plan / ED Course  I have reviewed the triage vital signs and the nursing notes.  Pertinent labs & imaging results that were available during my care of the patient were reviewed by me and considered in my medical decision making (see chart for details).  Clinical Course   35 year old male with seizure. Now back to baseline. Workup fairly unremarkable. He does have a tongue laceration but this should heal fine. Diet was discussed with regards to this. Outpatient neurology follow-up.  It has been determined that no acute conditions requiring further emergency intervention are present at this time. The patient has been advised of the diagnosis and plan. I reviewed any labs and imaging including any potential incidental findings. We have discussed signs and symptoms that warrant return to the ED and they are listed in the discharge instructions.    Final Clinical Impressions(s) / ED Diagnoses   Final diagnoses:  Seizure (HCC)  Laceration of tongue, initial encounter   New Prescriptions New Prescriptions   No medications on file   I personally preformed the services scribed in my presence. The recorded information has been reviewed is accurate. Tyler RazorStephen Duane Trias, MD.     Tyler RazorStephen Staci Carver, MD 10/06/16 [phone]

## 2016-10-04 NOTE — Discharge Instructions (Signed)
Follow up with neurology as scheduled.  

## 2016-10-04 NOTE — ED Triage Notes (Signed)
Per EMS pt complaint of witnessed seizure by girlfriend. Hematoma to left brow and complaint of diffuse headache.

## 2016-10-04 NOTE — ED Notes (Signed)
Bed: [id number] Expected date:  Expected time:  Means of arrival:  Comments: 35 yo M seizure

## 2016-11-17 ENCOUNTER — Emergency Department (HOSPITAL_COMMUNITY): Payer: Self-pay

## 2016-11-17 ENCOUNTER — Emergency Department (HOSPITAL_COMMUNITY)
Admission: EM | Admit: 2016-11-17 | Discharge: 2016-11-17 | Disposition: A | Discharge status: Home / Self Care | Payer: Self-pay | Attending: Emergency Medicine | Admitting: Emergency Medicine

## 2016-11-17 ENCOUNTER — Encounter (HOSPITAL_COMMUNITY): Payer: Self-pay | Admitting: Emergency Medicine

## 2016-11-17 DIAGNOSIS — F17210 Nicotine dependence, cigarettes, uncomplicated: Secondary | ICD-10-CM | POA: Insufficient documentation

## 2016-11-17 DIAGNOSIS — Z79899 Other long term (current) drug therapy: Secondary | ICD-10-CM | POA: Insufficient documentation

## 2016-11-17 DIAGNOSIS — G40909 Epilepsy, unspecified, not intractable, without status epilepticus: Secondary | ICD-10-CM | POA: Insufficient documentation

## 2016-11-17 DIAGNOSIS — I129 Hypertensive chronic kidney disease with stage 1 through stage 4 chronic kidney disease, or unspecified chronic kidney disease: Secondary | ICD-10-CM | POA: Insufficient documentation

## 2016-11-17 DIAGNOSIS — Y929 Unspecified place or not applicable: Secondary | ICD-10-CM | POA: Insufficient documentation

## 2016-11-17 DIAGNOSIS — S0083XA Contusion of other part of head, initial encounter: Secondary | ICD-10-CM | POA: Insufficient documentation

## 2016-11-17 DIAGNOSIS — Y999 Unspecified external cause status: Secondary | ICD-10-CM | POA: Insufficient documentation

## 2016-11-17 DIAGNOSIS — N182 Chronic kidney disease, stage 2 (mild): Secondary | ICD-10-CM | POA: Insufficient documentation

## 2016-11-17 DIAGNOSIS — Y939 Activity, unspecified: Secondary | ICD-10-CM | POA: Insufficient documentation

## 2016-11-17 DIAGNOSIS — W06XXXA Fall from bed, initial encounter: Secondary | ICD-10-CM | POA: Insufficient documentation

## 2016-11-17 DIAGNOSIS — R569 Unspecified convulsions: Secondary | ICD-10-CM

## 2016-11-17 LAB — CBC WITH DIFFERENTIAL/PLATELET
Basophils Absolute: 0 10*3/uL (ref 0.0–0.1)
Basophils Relative: 0 %
EOS PCT: 1 %
Eosinophils Absolute: 0.1 10*3/uL (ref 0.0–0.7)
HEMATOCRIT: 42.9 % (ref 39.0–52.0)
Hemoglobin: 14.5 g/dL (ref 13.0–17.0)
LYMPHS ABS: 1.5 10*3/uL (ref 0.7–4.0)
LYMPHS PCT: 11 %
MCH: 29.1 pg (ref 26.0–34.0)
MCHC: 33.8 g/dL (ref 30.0–36.0)
MCV: 86 fL (ref 78.0–100.0)
MONO ABS: 0.7 10*3/uL (ref 0.1–1.0)
MONOS PCT: 5 %
NEUTROS ABS: 11.6 10*3/uL — AB (ref 1.7–7.7)
Neutrophils Relative %: 83 %
PLATELETS: 221 10*3/uL (ref 150–400)
RBC: 4.99 MIL/uL (ref 4.22–5.81)
RDW: 14.2 % (ref 11.5–15.5)
WBC: 13.8 10*3/uL — ABNORMAL HIGH (ref 4.0–10.5)

## 2016-11-17 LAB — COMPREHENSIVE METABOLIC PANEL
ALT: 15 U/L — ABNORMAL LOW (ref 17–63)
AST: 24 U/L (ref 15–41)
Albumin: 4.4 g/dL (ref 3.5–5.0)
Alkaline Phosphatase: 56 U/L (ref 38–126)
Anion gap: 7 (ref 5–15)
BILIRUBIN TOTAL: 0.6 mg/dL (ref 0.3–1.2)
BUN: 11 mg/dL (ref 6–20)
CHLORIDE: 108 mmol/L (ref 101–111)
CO2: 22 mmol/L (ref 22–32)
Calcium: 8.4 mg/dL — ABNORMAL LOW (ref 8.9–10.3)
Creatinine, Ser: 1.64 mg/dL — ABNORMAL HIGH (ref 0.61–1.24)
GFR, EST NON AFRICAN AMERICAN: 52 mL/min — AB (ref >60)
Glucose, Bld: 93 mg/dL (ref 65–99)
POTASSIUM: 5 mmol/L (ref 3.5–5.1)
Sodium: 137 mmol/L (ref 135–145)
TOTAL PROTEIN: 7.5 g/dL (ref 6.5–8.1)

## 2016-11-17 LAB — RAPID URINE DRUG SCREEN, HOSP PERFORMED
AMPHETAMINES: NOT DETECTED
Barbiturates: NOT DETECTED
Benzodiazepines: NOT DETECTED
COCAINE: NOT DETECTED
OPIATES: NOT DETECTED
TETRAHYDROCANNABINOL: POSITIVE — AB

## 2016-11-17 LAB — CBG MONITORING, ED: GLUCOSE-CAPILLARY: 130 mg/dL — AB (ref 65–99)

## 2016-11-17 MED ORDER — SODIUM CHLORIDE 0.9 % IV SOLN
1500.0000 mg | Freq: Once | INTRAVENOUS | Status: AC
  Administered 2016-11-17: 1500 mg via INTRAVENOUS
  Filled 2016-11-17: qty 15

## 2016-11-17 MED ORDER — IBUPROFEN 800 MG PO TABS: 800.0000 mg | ORAL_TABLET | Freq: Three times a day (TID) | ORAL | 0 refills | Status: DC | PRN

## 2016-11-17 NOTE — Progress Notes (Signed)
Pt seen by ED CM previous as Cm reminded by male visitor at bedside Pt awaken and given permission to CM to speak with male visitor.  She confirms she has attemtped to get assist from [facility] for the orange card and discussed "Clydie BraunKaren" as her contact person at [facility] States when pt unable to get services in [place], he returned to [place] [facility] and [facility] neurolog[facility] per male visitor but she is unable to recall any of the provider names at this time  EDP or an attending may be able to access using care everywhere if needed EPIC updated [facility]

## 2016-11-17 NOTE — ED Notes (Signed)
Bed: [id number] Expected date:  Expected time:  Means of arrival:  Comments: Ems seizure

## 2016-11-17 NOTE — ED Notes (Signed)
Discharge instructions, follow up care, and rx x1 reviewed with patient. Patient verbalized understanding. 

## 2016-11-17 NOTE — ED Notes (Signed)
Patient states he was asleep and thinks he had a seizure and fell out of the bed and hit his head

## 2016-11-17 NOTE — ED Triage Notes (Signed)
Per EMS, witnessed seizure by girlfriend's daughter. Patient was initially combative. Patient takes keppra. Hematoma noted to head. Patient has hx of seizures.

## 2016-11-17 NOTE — Discharge Instructions (Signed)
Follow up with your md next week for recheck °

## 2016-11-17 NOTE — ED Provider Notes (Signed)
[facility]-EMERGENCY DEPT Provider Note   CSN: [id number] Arrival date & time: 11/17/16  0807     History   Chief Complaint Chief Complaint  Patient presents with  . Seizures    HPI Tyler Webb is a 35 y.o. male.      Patient had a seizure today. He did not take his medicine last night.   The history is provided by the patient and a friend. No language interpreter was used.  Seizures   This is a recurrent problem. The problem has been resolved. There was 1 seizure. Associated symptoms include sleepiness. Pertinent negatives include no headaches, no chest pain, no cough and no diarrhea. Characteristics include bladder incontinence. The episode was witnessed. There was no sensation of an aura present. The seizures did not continue in the ED. The seizure(s) had no focality. Possible causes include missed seizure meds. There has been no fever.    Past Medical History:  Diagnosis Date  . Hypertension   . Renal disorder    chronic kidney disease - stage 2  . Seizures [facility](HCC)     Patient Active Problem List   Diagnosis Date Noted  . Hypertension 07/14/2016  . Seizure disorder (HCC) 07/14/2016  . Obesity 07/14/2016    History reviewed. No pertinent surgical history.     Home Medications    Prior to Admission medications   Medication Sig Start Date End Date Taking? Authorizing Provider  levETIRAcetam (KEPPRA) 750 MG tablet Take 1,500 mg by mouth 2 (two) times daily. 11/08/16  Yes Historical Provider, MD  magnesium oxide (MAG-OX) 400 MG tablet Take 400 mg by mouth 2 (two) times daily.   Yes Historical Provider, MD  chlorhexidine (PERIDEX) 0.12 % solution Use as directed 15 mLs in the mouth or throat 2 (two) times daily. Patient not taking: Reported on 11/17/2016 06/10/16   Joni ReiningNicole Pisciotta, PA-C  ibuprofen (ADVIL,MOTRIN) 800 MG tablet Take 1 tablet (800 mg total) by mouth every 8 (eight) hours as needed for headache. 11/17/16   Bethann BerkshireJoseph Lajuane Leatham, MD  levETIRAcetam (KEPPRA) 500 MG  tablet Take 2 tablets (1,000 mg total) by mouth 2 (two) times daily. Patient not taking: Reported on 11/17/2016 07/13/16   Wynetta EmeryNicole Pisciotta, PA-C    Family History Family History  Problem Relation Age of Onset  . Diabetes Mother   . Kidney failure Mother     Dialysis  . Cancer Father     Social History Social History  Substance Use Topics  . Smoking status: Current Every Day Smoker    Packs/day: 0.50    Types: Cigarettes  . Smokeless tobacco: Never Used     Comment: 2-3 packs/day  . Alcohol use Yes     Comment: occ     Allergies   Patient has no known allergies.   Review of Systems Review of Systems  Constitutional: Negative for appetite change and fatigue.  HENT: Negative for congestion, ear discharge and sinus pressure.        Headache  Eyes: Negative for discharge.  Respiratory: Negative for cough.   Cardiovascular: Negative for chest pain.  Gastrointestinal: Negative for abdominal pain and diarrhea.  Genitourinary: Positive for bladder incontinence. Negative for frequency and hematuria.  Musculoskeletal: Negative for back pain.  Skin: Negative for rash.  Neurological: Positive for seizures. Negative for headaches.  Psychiatric/Behavioral: Negative for hallucinations.     Physical Exam Updated Vital Signs BP (!) 142/103 (BP Location: Right Arm)   Pulse 79   Temp 98.5 F (36.9 C) (Oral)  Resp 18   Ht 6' (1.829 m)   Wt 257 lb (116.6 kg)   SpO2 100%   BMI 34.86 kg/m   Physical Exam  Constitutional: He is oriented to person, place, and time. He appears well-developed.  HENT:  Right side of forehead has bruising and swelling  Eyes: Conjunctivae and EOM are normal. No scleral icterus.  Neck: Neck supple. No thyromegaly present.  Cardiovascular: Normal rate and regular rhythm.  Exam reveals no gallop and no friction rub.   No murmur heard. Pulmonary/Chest: No stridor. He has no wheezes. He has no rales. He exhibits no tenderness.  Abdominal: He  exhibits no distension. There is no tenderness. There is no rebound.  Musculoskeletal: Normal range of motion. He exhibits no edema.  Lymphadenopathy:    He has no cervical adenopathy.  Neurological: He is oriented to person, place, and time. He exhibits normal muscle tone. Coordination normal.  Patient mildly lethargic  Skin: No rash noted. No erythema.  Psychiatric: He has a normal mood and affect. His behavior is normal.     ED Treatments / Results  Labs (all labs ordered are listed, but only abnormal results are displayed) Labs Reviewed  CBC WITH DIFFERENTIAL/PLATELET - Abnormal; Notable for the following:       Result Value   WBC 13.8 (*)    Neutro Abs 11.6 (*)    All other components within normal limits  COMPREHENSIVE METABOLIC PANEL - Abnormal; Notable for the following:    Creatinine, Ser 1.64 (*)    Calcium 8.4 (*)    ALT 15 (*)    GFR calc non Af Amer 52 (*)    All other components within normal limits  RAPID URINE DRUG SCREEN, HOSP PERFORMED - Abnormal; Notable for the following:    Tetrahydrocannabinol POSITIVE (*)    All other components within normal limits  CBG MONITORING, ED - Abnormal; Notable for the following:    Glucose-Capillary 130 (*)    All other components within normal limits    EKG  EKG Interpretation None       Radiology Ct Head Wo Contrast  Result Date: 11/17/2016 CLINICAL DATA:  Pain following fall. Seizure occurred shortly before fall EXAM: CT HEAD WITHOUT CONTRAST CT CERVICAL SPINE WITHOUT CONTRAST TECHNIQUE: Multidetector CT imaging of the head and cervical spine was performed following the standard protocol without intravenous contrast. Multiplanar CT image reconstructions of the cervical spine were also generated. COMPARISON:  Cervical spine CT November 14, 2007; head CT August 08, 2009 FINDINGS: CT HEAD FINDINGS Brain: The ventricles are normal in size and configuration. There is no intracranial mass, hemorrhage, extra-axial fluid  collection, or midline shift. Gray-white compartments are normal. No acute infarct evident. Vascular: There is no hyperdense vessel. There is no appreciable vascular calcification. Skull: Bony calvarium appears intact. There is a right frontal scalp hematoma. Sinuses/Orbits: Visualized paranasal sinuses are clear. Visualized orbits appear symmetric bilaterally. Other: Mastoid air cells are clear. CT CERVICAL SPINE FINDINGS Alignment: There is no spondylolisthesis. Skull base and vertebrae: Craniocervical junction and skull base regions appear normal. There is no fracture. There are no blastic or lytic bone lesions. Soft tissues and spinal canal: Prevertebral soft tissues and predental space regions are normal. There are no paraspinous lesions. No spinal stenosis evident. Disc levels: There is calcification in the anterior longitudinal ligament at C4-5. There is no appreciable disc space narrowing. No nerve root edema or effacement. No disc extrusion or stenosis. Upper chest: Visualized lung apices are clear. Other:  None IMPRESSION: CT head: No intracranial mass, hemorrhage, or extra-axial fluid collection. Gray-white compartments appear normal. There is a right frontal scalp hematoma. No fracture evident. CT cervical spine: No fracture or spondylolisthesis. No appreciable arthropathy. Calcification is noted focally in the anterior longitudinal ligament at C4-5. Electronically Signed   By: Bretta Bang III M.D.   On: 11/17/2016 09:47   Ct Cervical Spine Wo Contrast  Result Date: 11/17/2016 CLINICAL DATA:  Pain following fall. Seizure occurred shortly before fall EXAM: CT HEAD WITHOUT CONTRAST CT CERVICAL SPINE WITHOUT CONTRAST TECHNIQUE: Multidetector CT imaging of the head and cervical spine was performed following the standard protocol without intravenous contrast. Multiplanar CT image reconstructions of the cervical spine were also generated. COMPARISON:  Cervical spine CT November 14, 2007; head CT  August 08, 2009 FINDINGS: CT HEAD FINDINGS Brain: The ventricles are normal in size and configuration. There is no intracranial mass, hemorrhage, extra-axial fluid collection, or midline shift. Gray-white compartments are normal. No acute infarct evident. Vascular: There is no hyperdense vessel. There is no appreciable vascular calcification. Skull: Bony calvarium appears intact. There is a right frontal scalp hematoma. Sinuses/Orbits: Visualized paranasal sinuses are clear. Visualized orbits appear symmetric bilaterally. Other: Mastoid air cells are clear. CT CERVICAL SPINE FINDINGS Alignment: There is no spondylolisthesis. Skull base and vertebrae: Craniocervical junction and skull base regions appear normal. There is no fracture. There are no blastic or lytic bone lesions. Soft tissues and spinal canal: Prevertebral soft tissues and predental space regions are normal. There are no paraspinous lesions. No spinal stenosis evident. Disc levels: There is calcification in the anterior longitudinal ligament at C4-5. There is no appreciable disc space narrowing. No nerve root edema or effacement. No disc extrusion or stenosis. Upper chest: Visualized lung apices are clear. Other: None IMPRESSION: CT head: No intracranial mass, hemorrhage, or extra-axial fluid collection. Gray-white compartments appear normal. There is a right frontal scalp hematoma. No fracture evident. CT cervical spine: No fracture or spondylolisthesis. No appreciable arthropathy. Calcification is noted focally in the anterior longitudinal ligament at C4-5. Electronically Signed   By: Bretta Bang III M.D.   On: 11/17/2016 09:47    Procedures Procedures (including critical care time)  Medications Ordered in ED Medications  levETIRAcetam (KEPPRA) 1,500 mg in sodium chloride 0.9 % 100 mL IVPB (0 mg Intravenous Stopped 11/17/16 1232)     Initial Impression / Assessment and Plan / ED Course  I have reviewed the triage vital signs and the  nursing notes.  Pertinent labs & imaging results that were available during my care of the patient were reviewed by me and considered in my medical decision making (see chart for details).  Clinical Course     Patient with seizure and fall and head injury. Labs CT head unremarkable. Patient missed 1 dose of seizure medicine last night. He is instructed to make sure he takes his seizure medicine patient given some Motrin for pain will follow-up with his doctor  Final Clinical Impressions(s) / ED Diagnoses   Final diagnoses:  Seizure (HCC)    New Prescriptions New Prescriptions   IBUPROFEN (ADVIL,MOTRIN) 800 MG TABLET    Take 1 tablet (800 mg total) by mouth every 8 (eight) hours as needed for headache.     Bethann Berkshire, MD 11/17/16 [phone]

## 2016-11-17 NOTE — ED Notes (Signed)
RN is starting a line at this time and will collect blood work

## 2017-02-02 ENCOUNTER — Emergency Department (HOSPITAL_COMMUNITY): Payer: Self-pay

## 2017-02-02 ENCOUNTER — Emergency Department (HOSPITAL_COMMUNITY)
Admission: EM | Admit: 2017-02-02 | Discharge: 2017-02-02 | Disposition: A | Discharge status: Home / Self Care | Payer: Medicaid Other | Attending: Emergency Medicine | Admitting: Emergency Medicine

## 2017-02-02 ENCOUNTER — Emergency Department (HOSPITAL_COMMUNITY)
Admission: EM | Admit: 2017-02-02 | Discharge: 2017-02-03 | Disposition: A | Discharge status: Home / Self Care | Payer: Self-pay | Attending: Emergency Medicine | Admitting: Emergency Medicine

## 2017-02-02 ENCOUNTER — Encounter (HOSPITAL_COMMUNITY): Payer: Self-pay

## 2017-02-02 ENCOUNTER — Encounter (HOSPITAL_COMMUNITY): Payer: Self-pay | Admitting: Emergency Medicine

## 2017-02-02 DIAGNOSIS — F17210 Nicotine dependence, cigarettes, uncomplicated: Secondary | ICD-10-CM | POA: Insufficient documentation

## 2017-02-02 DIAGNOSIS — N182 Chronic kidney disease, stage 2 (mild): Secondary | ICD-10-CM | POA: Insufficient documentation

## 2017-02-02 DIAGNOSIS — Z79899 Other long term (current) drug therapy: Secondary | ICD-10-CM | POA: Insufficient documentation

## 2017-02-02 DIAGNOSIS — R569 Unspecified convulsions: Secondary | ICD-10-CM

## 2017-02-02 DIAGNOSIS — G40909 Epilepsy, unspecified, not intractable, without status epilepticus: Secondary | ICD-10-CM | POA: Insufficient documentation

## 2017-02-02 DIAGNOSIS — I129 Hypertensive chronic kidney disease with stage 1 through stage 4 chronic kidney disease, or unspecified chronic kidney disease: Secondary | ICD-10-CM | POA: Insufficient documentation

## 2017-02-02 DIAGNOSIS — G40919 Epilepsy, unspecified, intractable, without status epilepticus: Secondary | ICD-10-CM

## 2017-02-02 LAB — CBC
HEMATOCRIT: 42.1 % (ref 39.0–52.0)
HEMOGLOBIN: 14.1 g/dL (ref 13.0–17.0)
MCH: 28.8 pg (ref 26.0–34.0)
MCHC: 33.5 g/dL (ref 30.0–36.0)
MCV: 86.1 fL (ref 78.0–100.0)
Platelets: 185 10*3/uL (ref 150–400)
RBC: 4.89 MIL/uL (ref 4.22–5.81)
RDW: 14.5 % (ref 11.5–15.5)
WBC: 7.9 10*3/uL (ref 4.0–10.5)

## 2017-02-02 LAB — BASIC METABOLIC PANEL
ANION GAP: 7 (ref 5–15)
BUN: 8 mg/dL (ref 6–20)
CO2: 27 mmol/L (ref 22–32)
Calcium: 8.8 mg/dL — ABNORMAL LOW (ref 8.9–10.3)
Chloride: 106 mmol/L (ref 101–111)
Creatinine, Ser: 1.67 mg/dL — ABNORMAL HIGH (ref 0.61–1.24)
GFR calc Af Amer: 59 mL/min — ABNORMAL LOW (ref >60)
GFR calc non Af Amer: 51 mL/min — ABNORMAL LOW (ref >60)
GLUCOSE: 116 mg/dL — AB (ref 65–99)
POTASSIUM: 4.5 mmol/L (ref 3.5–5.1)
Sodium: 140 mmol/L (ref 135–145)

## 2017-02-02 LAB — CBG MONITORING, ED
GLUCOSE-CAPILLARY: 113 mg/dL — AB (ref 65–99)
Glucose-Capillary: 100 mg/dL — ABNORMAL HIGH (ref 65–99)

## 2017-02-02 MED ORDER — SODIUM CHLORIDE 0.9 % IV BOLUS (SEPSIS)
1000.0000 mL | Freq: Once | INTRAVENOUS | Status: AC
  Administered 2017-02-02: 1000 mL via INTRAVENOUS

## 2017-02-02 MED ORDER — KETOROLAC TROMETHAMINE 15 MG/ML IJ SOLN
15.0000 mg | Freq: Once | INTRAMUSCULAR | Status: AC
  Administered 2017-02-02: 15 mg via INTRAVENOUS
  Filled 2017-02-02: qty 1

## 2017-02-02 MED ORDER — FENTANYL CITRATE (PF) 100 MCG/2ML IJ SOLN
50.0000 ug | Freq: Once | INTRAMUSCULAR | Status: AC
  Administered 2017-02-02: 50 ug via INTRAVENOUS
  Filled 2017-02-02: qty 2

## 2017-02-02 NOTE — ED Triage Notes (Signed)
Patient arrives by EMS with complaints of seizure x2 today. Patient's Mother called 911 this evening, reported that patient seized for "3 minutes". Mother stated seizure earlier today and transported to hospital and was discharged. EMS found patient post-ictal in living room floor, tongue bitten-may be from earlier seizure-no fresh bleeding noted. SPO2 96%, BP 150/90, HR 100.

## 2017-02-02 NOTE — Discharge Instructions (Signed)
Please be aware you may have another seizure ° °Do not drive until seen by your physician for your condition ° °Do not climb ladders/roofs/trees as a seizure can occur at that height and cause serious harm ° °Do not bathe/swim alone as a seizure can occur and cause serious harm ° °Please followup with your physician or neurologist for further testing and possible treatment ° ° °

## 2017-02-02 NOTE — ED Provider Notes (Signed)
[facility]-EMERGENCY DEPT Provider Note   CSN: [id number] Arrival date & time: 02/02/17  0306     History   Chief Complaint Chief Complaint  Patient presents with  . Seizures    HPI Tyler Primrosethan Q Webb is a 36 y.o. male.  The history is provided by the patient.  Seizures   This is a new problem. The problem has been resolved. There was 1 seizure. Associated symptoms include headaches. Pertinent negatives include no chest pain. The episode was witnessed. The seizures did not continue in the ED. The seizure(s) had no focality. Possible causes do not include missed seizure meds. There has been no fever. There were no medications administered prior to arrival.  Patient with h/o seizure presents from home for seizures He was found having a seizure that lasted several minutes It was a generalized seizure It terminated spontaneously He reports he had otherwise been well prior to this episode without any recent fever/chills or other illness He now has headache and myalgias since the seizure episode Last seizure was over 2 months ago He reports med compliance (keppra)  Past Medical History:  Diagnosis Date  . Hypertension   . Renal disorder    chronic kidney disease - stage 2  . Seizures [facility](HCC)     Patient Active Problem List   Diagnosis Date Noted  . Hypertension 07/14/2016  . Seizure disorder (HCC) 07/14/2016  . Obesity 07/14/2016    History reviewed. No pertinent surgical history.     Home Medications    Prior to Admission medications   Medication Sig Start Date End Date Taking? Authorizing Provider  levETIRAcetam (KEPPRA) 750 MG tablet Take 1,500 mg by mouth 2 (two) times daily. 11/08/16  Yes Historical Provider, MD  magnesium oxide (MAG-OX) 400 MG tablet Take 400 mg by mouth 2 (two) times daily.   Yes Historical Provider, MD  chlorhexidine (PERIDEX) 0.12 % solution Use as directed 15 mLs in the mouth or throat 2 (two) times daily. Patient not taking: Reported on 11/17/2016  06/10/16   Joni ReiningNicole Pisciotta, PA-C  ibuprofen (ADVIL,MOTRIN) 800 MG tablet Take 1 tablet (800 mg total) by mouth every 8 (eight) hours as needed for headache. Patient not taking: Reported on 02/02/2017 11/17/16   Bethann BerkshireJoseph Zammit, MD  levETIRAcetam (KEPPRA) 500 MG tablet Take 2 tablets (1,000 mg total) by mouth 2 (two) times daily. Patient not taking: Reported on 11/17/2016 07/13/16   Wynetta EmeryNicole Pisciotta, PA-C    Family History Family History  Problem Relation Age of Onset  . Diabetes Mother   . Kidney failure Mother     Dialysis  . Cancer Father     Social History Social History  Substance Use Topics  . Smoking status: Current Every Day Smoker    Packs/day: 0.50    Types: Cigarettes  . Smokeless tobacco: Never Used     Comment: 2-3 packs/day  . Alcohol use Yes     Comment: occ     Allergies   Patient has no known allergies.   Review of Systems Review of Systems  Constitutional: Negative for fever.  Cardiovascular: Negative for chest pain.  Gastrointestinal: Negative for abdominal pain.  Musculoskeletal: Positive for myalgias.  Neurological: Positive for seizures and headaches.  All other systems reviewed and are negative.    Physical Exam Updated Vital Signs BP 155/91   Pulse 64   Temp 98.1 F (36.7 C)   Resp 15   Ht 6' (1.829 m)   Wt 115.7 kg   SpO2 98%  BMI 34.58 kg/m   Physical Exam CONSTITUTIONAL: Well developed/well nourished HEAD: Normocephalic/atraumatic EYES: EOMI/PERRL ENMT: Mucous membranes moist, no tongue lacerations noted NECK: supple no meningeal signs SPINE/BACK:entire spine nontender CV: S1/S2 noted, no murmurs/rubs/gallops noted LUNGS: Lungs are clear to auscultation bilaterally, no apparent distress ABDOMEN: soft, nontender, no rebound or guarding, bowel sounds noted throughout abdomen GU:no cva tenderness NEURO: Pt is awake/alert/appropriate, moves all extremitiesx4.  No facial droop.  No arm or leg drift EXTREMITIES: pulses normal/equal,  full ROM, no deformities noted SKIN: warm, color normal PSYCH: no abnormalities of mood noted, alert and oriented to situation   ED Treatments / Results  Labs (all labs ordered are listed, but only abnormal results are displayed) Labs Reviewed  BASIC METABOLIC PANEL - Abnormal; Notable for the following:       Result Value   Glucose, Bld 116 (*)    Creatinine, Ser 1.67 (*)    Calcium 8.8 (*)    GFR calc non Af Amer 51 (*)    GFR calc Af Amer 59 (*)    All other components within normal limits  CBG MONITORING, ED - Abnormal; Notable for the following:    Glucose-Capillary 113 (*)    All other components within normal limits  CBC    EKG  EKG Interpretation None       Radiology No results found.  Procedures Procedures (including critical care time)  Medications Ordered in ED Medications  sodium chloride 0.9 % bolus 1,000 mL (0 mLs Intravenous Stopped 02/02/17 0703)  sodium chloride 0.9 % bolus 1,000 mL (0 mLs Intravenous Stopped 02/02/17 0703)  ketorolac (TORADOL) 15 MG/ML injection 15 mg (15 mg Intravenous Given 02/02/17 0534)  fentaNYL (SUBLIMAZE) injection 50 mcg (50 mcg Intravenous Given 02/02/17 0534)     Initial Impression / Assessment and Plan / ED Course  I have reviewed the triage vital signs and the nursing notes.  Pertinent labs &results that were available during my care of the patient were reviewed by me and considered in my medical decision making (see chart for details).     Pt at baseline Labs near baseline No distress noted Advised to continue meds Given neurology followup Advised no driving until seen by neuro Will d/c home  Final Clinical Impressions(s) / ED Diagnoses   Final diagnoses:  Seizure [facility])    New Prescriptions New Prescriptions   No medications on file     Zadie Rhine, MD 02/02/17 [phone]

## 2017-02-02 NOTE — ED Notes (Signed)
Bed: WA09 Expected date:  Expected time:  Means of arrival:  Comments: seizures

## 2017-02-02 NOTE — ED Notes (Signed)
Bed: WA17 Expected date:  Expected time:  Means of arrival:  Comments: EMS 36 yo male seizure x 2-post-ictal CBG 96

## 2017-02-02 NOTE — ED Triage Notes (Signed)
Brought in by EMS from home with c/o seizures.  Pt has had a grand mal seizure for approximately one minute, witnessed by pt's mother.  Pt gradually returned back to his normal wakefulness en route.  Pt awake and A/Ox 4 on arrival, with c/o headache.  Pt compliant with his seizure medication (Keppra).

## 2017-02-02 NOTE — ED Provider Notes (Signed)
[facility]-EMERG[facility]Y DEPT Provider Note   CSN: [id number] Arrival date & time: 02/02/17  2130  By signing my name below, I, [name], attest that this documentation has been prepared under the direction and in the presence of Geoffery Lyons, MD. Electronically Signed: [name], ED Scribe. 02/02/17. 11:34 PM.  History   Chief Complaint Chief Complaint  Patient presents with  . Seizures   The history is provided by the patient and medical records. No language interpreter was used.  Seizures   This is a recurrent problem. The current episode started 1 to 2 hours ago. The problem has been resolved. There were 2 to 3 seizures. The most recent episode lasted 2 to 5 minutes. Associated symptoms include headaches. Pertinent negatives include no cough, no nausea and no vomiting. Characteristics include rhythmic jerking. The episode was witnessed. The seizures did not continue in the ED. The seizure(s) had no focality. Possible causes do not include medication or dosage change, missed seizure meds or recent illness. There has been no fever.    HPI Comments: Tyler Webb is a 36 y.o. male BIB EMS, with a h/o HTN, CKD stage II, obesity, and seizures, who presents to the Emergency Department s/p two witnessed episodes of seizure-like activity described as full body rhythmic jerking which occurred PTA. Pt was seen in the ED for same approximately 20 hours ago. His basic labs at that time were trending at his baseline. Per son, following being seen in the ED this morning he came home and was acting normal; however, this evening he had two episodes of seizure-like activity. He notes that both of these episodes occurred in under five minutes, and his mother had previously reported that his second seizure lasted for approximately 3 minutes. His son also reports that he has had and increasing amount of seizures over the past two months, which is atypical for the pt because he has only typically had  one every few months. Pt reports that he has had a generalized headache since his episode of seizures. He is currently medicated on Keppra daily for this issue, and he has been compliant with this medication recently. There have been no recent changes in this medication. No recent illness/infections. He denies nausea, vomiting, fever, cough, or any other associated symptoms.   Neurologist: Glynda Jaeger, MD [facility]  Past Medical History:  Diagnosis Date  . Hypertension   . Renal disorder    chronic kidney disease - stage 2  . Seizures [facility]    Patient Active Problem List   Diagnosis Date Noted  . Hypertension 07/14/2016  . Seizure disorder (HCC) 07/14/2016  . Obesity 07/14/2016   History reviewed. No pertinent surgical history.  Home Medications    Prior to Admission medications   Medication Sig Start Date End Date Taking? Authorizing Provider  levETIRAcetam (KEPPRA) 750 MG tablet Take 1,500 mg by mouth 2 (two) times daily.   Yes Historical Provider, MD   Family History Family History  Problem Relation Age of Onset  . Diabetes Mother   . Kidney failure Mother     Dialysis  . Cancer Father    Social History Social History  Substance Use Topics  . Smoking status: Current Every Day Smoker    Packs/day: 0.50    Types: Cigarettes  . Smokeless tobacco: Never Used     Comment: 2-3 packs/day  . Alcohol use Yes     Comment: occ   Allergies   Patient has no known allergies.  Review  of Systems Review of Systems  Constitutional: Negative for fever.  Respiratory: Negative for cough.   Gastrointestinal: Negative for nausea and vomiting.  Neurological: Positive for seizures and headaches.  All other systems reviewed and are negative.  Physical Exam Updated Vital Signs BP 144/93 (BP Location: Left Arm)   Pulse 85   Temp 98.4 F (36.9 C) (Oral)   Resp 25   SpO2 97%   Physical Exam  Constitutional: He is oriented to person, place, and time. He appears well-developed and  well-nourished.  HENT:  Head: Normocephalic and atraumatic.  Eyes: EOM are normal. Pupils are equal, round, and reactive to light.  Neck: Normal range of motion.  Cardiovascular: Normal rate, regular rhythm, normal heart sounds and intact distal pulses.   Pulmonary/Chest: Effort normal and breath sounds normal. No respiratory distress.  Abdominal: Soft. He exhibits no distension. There is no tenderness.  Musculoskeletal: Normal range of motion.  Neurological: He is alert and oriented to person, place, and time. No cranial nerve deficit. He exhibits normal muscle tone. Coordination normal.  5/5 strength in major muscle groups of bilateral upper and lower extremities. Speech normal. No facial asymetry.   Skin: Skin is warm and dry.  Psychiatric: He has a normal mood and affect. Judgment normal.  Nursing note and vitals reviewed.  ED Treatments / Results  DIAGNOSTIC STUDIES: Oxygen Saturation is 97% on RA, normal by my interpretation.   COORDINATION OF CARE: 11:31 PM-Discussed next steps with pt including CT Head. Pt verbalized understanding and is agreeable with the plan.   Labs (all labs ordered are listed, but only abnormal results are displayed) Labs Reviewed  CBG MONITORING, ED - Abnormal; Notable for the following:       Result Value   Glucose-Capillary 100 (*)    All other components within normal limits  CBG MONITORING, ED   EKG  EKG Interpretation None      Radiology No results found.  Procedures Procedures   Medications Ordered in ED Medications - No data to display  Initial Impression / Assessment and Plan / ED Course  I have reviewed the triage vital signs and the nursing notes.  Pertinent labs & imaging results that were available during my care of the patient were reviewed by me and considered in my medical decision making (see chart for details).     Patient returns with complaints of seizure. He was diagnosed with a seizure disorder 2 years ago and  [facility]. He has been on an increasing dose of Keppra since that time. He was in the emergency department here this morning after having a seizure at home. Workup was performed, then he was discharged. He returns today after experiencing additional seizure activity after discharge.  He is complaining of headache, but is neurologically intact and his head CT is negative. Laboratory studies are reassuring with the exception of testing positive for cocaine and marijuana. I've spoken with Dr. Otelia LimesLindzen from neurology who does not recommend any additional medications, just counseling the patient that he needs to stop using illicit drugs.  He will be discharged, to follow-up with neurology. He apparently has already had a referral made and his appointment is pending.  Final Clinical Impressions(s) / ED Diagnoses   Final diagnoses:  None   New Prescriptions New Prescriptions   No medications on file   I personally performed the services described in this documentation, which was scribed in my presence. The recorded information has been reviewed and is accurate.  Geoffery Lyons, MD 02/03/17 [phone]

## 2017-02-03 LAB — URINALYSIS, ROUTINE W REFLEX MICROSCOPIC
Bilirubin Urine: NEGATIVE
GLUCOSE, UA: NEGATIVE mg/dL
KETONES UR: NEGATIVE mg/dL
LEUKOCYTES UA: NEGATIVE
NITRITE: NEGATIVE
PROTEIN: NEGATIVE mg/dL
Specific Gravity, Urine: 1.014 (ref 1.005–1.030)
Squamous Epithelial / HPF: NONE SEEN
pH: 5 (ref 5.0–8.0)

## 2017-02-03 LAB — RAPID URINE DRUG SCREEN, HOSP PERFORMED
Amphetamines: NOT DETECTED
Barbiturates: NOT DETECTED
Benzodiazepines: NOT DETECTED
Cocaine: POSITIVE — AB
Opiates: NOT DETECTED
Tetrahydrocannabinol: POSITIVE — AB

## 2017-02-03 LAB — MAGNESIUM: MAGNESIUM: 2.3 mg/dL (ref 1.7–2.4)

## 2017-02-03 LAB — ETHANOL: Alcohol, Ethyl (B): <5 mg/dL (ref <5)

## 2017-02-03 MED ORDER — HYDROCODONE-ACETAMINOPHEN 5-325 MG PO TABS
2.0000 | ORAL_TABLET | Freq: Once | ORAL | Status: AC
  Administered 2017-02-03: 2 via ORAL
  Filled 2017-02-03: qty 2

## 2017-02-03 NOTE — ED Notes (Signed)
Patient resting quietly with eyes closed-asleep-awakened-states 10/10 frontal headache. Medicated as ordered

## 2017-02-03 NOTE — Discharge Instructions (Signed)
Continue your Keppra as previously prescribed.  Refrain from cocaine and marijuana use.  Follow-up with your neurologist in the next week, and return to the ER if you experience any new or concerning symptoms.

## 2017-02-28 ENCOUNTER — Encounter (HOSPITAL_COMMUNITY): Payer: Self-pay | Admitting: Emergency Medicine

## 2017-02-28 ENCOUNTER — Emergency Department (HOSPITAL_COMMUNITY)
Admission: EM | Admit: 2017-02-28 | Discharge: 2017-02-28 | Disposition: A | Discharge status: Home / Self Care | Payer: Medicaid Other | Attending: Emergency Medicine | Admitting: Emergency Medicine

## 2017-02-28 DIAGNOSIS — R569 Unspecified convulsions: Secondary | ICD-10-CM

## 2017-02-28 DIAGNOSIS — F17210 Nicotine dependence, cigarettes, uncomplicated: Secondary | ICD-10-CM | POA: Insufficient documentation

## 2017-02-28 DIAGNOSIS — Z79899 Other long term (current) drug therapy: Secondary | ICD-10-CM | POA: Insufficient documentation

## 2017-02-28 DIAGNOSIS — G40909 Epilepsy, unspecified, not intractable, without status epilepticus: Secondary | ICD-10-CM | POA: Insufficient documentation

## 2017-02-28 DIAGNOSIS — I10 Essential (primary) hypertension: Secondary | ICD-10-CM | POA: Insufficient documentation

## 2017-02-28 LAB — CBG MONITORING, ED: Glucose-Capillary: 118 mg/dL — ABNORMAL HIGH (ref 65–99)

## 2017-02-28 LAB — I-STAT CHEM 8, ED
BUN: 14 mg/dL (ref 6–20)
CREATININE: 1.4 mg/dL — AB (ref 0.61–1.24)
Calcium, Ion: 1.12 mmol/L — ABNORMAL LOW (ref 1.15–1.40)
Chloride: 103 mmol/L (ref 101–111)
GLUCOSE: 113 mg/dL — AB (ref 65–99)
HCT: 45 % (ref 39.0–52.0)
HEMOGLOBIN: 15.3 g/dL (ref 13.0–17.0)
POTASSIUM: 4.2 mmol/L (ref 3.5–5.1)
Sodium: 139 mmol/L (ref 135–145)
TCO2: 27 mmol/L (ref 0–100)

## 2017-02-28 NOTE — ED Notes (Signed)
Bed: [id number] Expected date:  Expected time:  Means of arrival:  Comments: 35 m seizure

## 2017-02-28 NOTE — ED Triage Notes (Signed)
Pt BIB EMS from home after having witnessed seizure by friend; per friend, seizure lasted about 3 minutes; pt post-ictal upon EMS arrival; injury noted to both sides of tongue; bleeding controlled; A&Ox4; pt takes Keppra and states he has been taking it correctly

## 2017-02-28 NOTE — Discharge Instructions (Signed)
You can be discharged home this morning. Follow up with your neurologist as soon as possible for further management of seizures. Return here as needed for concerning symptoms.

## 2017-03-06 NOTE — ED Provider Notes (Signed)
[facility]-EMER[facility] DEPT Provider Note   CSN: [id number] Arrival date & time: 02/28/17  0450     History   Chief Complaint Chief Complaint  Patient presents with  . Seizures    HPI Tyler Webb is a 36 y.o. male.  Patient presents after witnessed seizure x 1 tonight. Last seizure was one month ago which is typical of his recurrent seizure pattern with his disorder. He reports being compliant with his Keppra dosing. Per report the episode lasted approximately 4 minutes followed by post-ictal period which EMS reports was resolving in route. The patient complains of tongue abrasion only. No other injury from seizure.    The history is provided by the patient. No language interpreter was used.    Past Medical History:  Diagnosis Date  . Hypertension   . Renal disorder    chronic kidney disease - stage 2  . Seizures [facility](HCC)     Patient Active Problem List   Diagnosis Date Noted  . Hypertension 07/14/2016  . Seizure disorder (HCC) 07/14/2016  . Obesity 07/14/2016    History reviewed. No pertinent surgical history.     Home Medications    Prior to Admission medications   Medication Sig Start Date End Date Taking? Authorizing Provider  levETIRAcetam (KEPPRA) 750 MG tablet Take 1,500 mg by mouth 2 (two) times daily.   Yes Historical Provider, MD    Family History Family History  Problem Relation Age of Onset  . Diabetes Mother   . Kidney failure Mother     Dialysis  . Cancer Father     Social History Social History  Substance Use Topics  . Smoking status: Current Every Day Smoker    Packs/day: 0.50    Types: Cigarettes  . Smokeless tobacco: Never Used  . Alcohol use Yes     Comment: occ     Allergies   Patient has no known allergies.   Review of Systems Review of Systems  Constitutional: Negative for chills and fever.  HENT: Positive for mouth sores.   Respiratory: Negative.   Cardiovascular: Negative.   Gastrointestinal: Negative.   Musculoskeletal:  Negative.   Skin: Negative.   Neurological: Positive for seizures.     Physical Exam Updated Vital Signs BP (!) 128/102 (BP Location: Right Arm)   Pulse 79   Temp 98.1 F (36.7 C) (Oral)   Resp 16   SpO2 98%   Physical Exam  Constitutional: He is oriented to person, place, and time. He appears well-developed and well-nourished.  HENT:  Head: Normocephalic and atraumatic.  Bilateral tongue contusion without laceration.  Eyes: EOM are normal. Pupils are equal, round, and reactive to light.  Neck: Normal range of motion.  Cardiovascular: Normal rate and regular rhythm.   No murmur heard. Pulmonary/Chest: Effort normal and breath sounds normal. He has no wheezes. He has no rales.  Abdominal: Soft. There is no tenderness.  Musculoskeletal: Normal range of motion.  Neurological: He is alert and oriented to person, place, and time. He has normal strength and normal reflexes. No sensory deficit. He exhibits normal muscle tone. He displays a negative Romberg sign. Coordination normal.  Skin: Skin is warm and dry.     ED Treatments / Results  Labs (all labs ordered are listed, but only abnormal results are displayed) Labs Reviewed  CBG MONITORING, ED - Abnormal; Notable for the following:       Result Value   Glucose-Capillary 118 (*)    All other components within normal limits  I-STAT CHEM 8, ED - Abnormal; Notable for the following:    Creatinine, Ser 1.40 (*)    Glucose, Bld 113 (*)    Calcium, Ion 1.12 (*)    All other components within normal limits    EKG  EKG Interpretation None       Radiology No results found.  Procedures Procedures (including critical care time)  Medications Ordered in ED Medications - No data to display   Initial Impression / Assessment and Plan / ED Course  I have reviewed the triage vital signs and the nursing notes.  Pertinent labs & imaging results that were available during my care of the patient were reviewed by me and  considered in my medical decision making (see chart for details).     Patient presents after seizure, typical for seizure pattern with his epilepsy. He is back to baseline now. Chemistries are normal. He is not out of his medication and reports compliance at high dose (1500 mg BID) Keppra. Recommend follow up with neurology to re-evaluate treatment options.   Final Clinical Impressions(s) / ED Diagnoses   Final diagnoses:  Seizure [facility])  Seizure disorder [facility])    New Prescriptions Discharge Medication List as of 02/28/2017  6:39 AM       Elpidio Anis, PA-C 03/06/17 0120    Shon Baton, MD 03/07/17 [phone]

## 2017-05-19 ENCOUNTER — Encounter (HOSPITAL_COMMUNITY): Payer: Self-pay | Admitting: Emergency Medicine

## 2017-05-19 ENCOUNTER — Emergency Department (HOSPITAL_COMMUNITY)
Admission: EM | Admit: 2017-05-19 | Discharge: 2017-05-19 | Disposition: A | Discharge status: Home / Self Care | Payer: Medicaid Other | Attending: Emergency Medicine | Admitting: Emergency Medicine

## 2017-05-19 ENCOUNTER — Emergency Department (HOSPITAL_COMMUNITY): Payer: Medicaid Other

## 2017-05-19 DIAGNOSIS — G40909 Epilepsy, unspecified, not intractable, without status epilepticus: Secondary | ICD-10-CM | POA: Insufficient documentation

## 2017-05-19 DIAGNOSIS — F17210 Nicotine dependence, cigarettes, uncomplicated: Secondary | ICD-10-CM | POA: Insufficient documentation

## 2017-05-19 DIAGNOSIS — N182 Chronic kidney disease, stage 2 (mild): Secondary | ICD-10-CM | POA: Insufficient documentation

## 2017-05-19 DIAGNOSIS — I129 Hypertensive chronic kidney disease with stage 1 through stage 4 chronic kidney disease, or unspecified chronic kidney disease: Secondary | ICD-10-CM | POA: Insufficient documentation

## 2017-05-19 DIAGNOSIS — Z79899 Other long term (current) drug therapy: Secondary | ICD-10-CM | POA: Insufficient documentation

## 2017-05-19 DIAGNOSIS — R569 Unspecified convulsions: Secondary | ICD-10-CM

## 2017-05-19 LAB — BASIC METABOLIC PANEL
Anion gap: 14 (ref 5–15)
BUN: 6 mg/dL (ref 6–20)
CO2: 19 mmol/L — ABNORMAL LOW (ref 22–32)
CREATININE: 1.81 mg/dL — AB (ref 0.61–1.24)
Calcium: 8.9 mg/dL (ref 8.9–10.3)
Chloride: 104 mmol/L (ref 101–111)
GFR calc Af Amer: 53 mL/min — ABNORMAL LOW (ref >60)
GFR, EST NON AFRICAN AMERICAN: 46 mL/min — AB (ref >60)
Glucose, Bld: 122 mg/dL — ABNORMAL HIGH (ref 65–99)
POTASSIUM: 6 mmol/L — AB (ref 3.5–5.1)
SODIUM: 137 mmol/L (ref 135–145)

## 2017-05-19 LAB — CBC
HCT: 45 % (ref 39.0–52.0)
Hemoglobin: 14.8 g/dL (ref 13.0–17.0)
MCH: 29.1 pg (ref 26.0–34.0)
MCHC: 32.9 g/dL (ref 30.0–36.0)
MCV: 88.6 fL (ref 78.0–100.0)
PLATELETS: 284 10*3/uL (ref 150–400)
RBC: 5.08 MIL/uL (ref 4.22–5.81)
RDW: 14.4 % (ref 11.5–15.5)
WBC: 11.7 10*3/uL — ABNORMAL HIGH (ref 4.0–10.5)

## 2017-05-19 MED ORDER — SODIUM CHLORIDE 0.9 % IV SOLN
1500.0000 mg | Freq: Once | INTRAVENOUS | Status: AC
  Administered 2017-05-19: 1500 mg via INTRAVENOUS
  Filled 2017-05-19: qty 15

## 2017-05-19 NOTE — ED Triage Notes (Signed)
BIB EMS from home, pt mother called out for seizure. Pt has hx. Pt post ictal upon EMS arrival. Given 5 Versed IM en route. Pt only responds to pain, very combative. EDP at bedside. Seizure pads in place.

## 2017-05-19 NOTE — ED Notes (Signed)
Pt back from CT

## 2017-05-19 NOTE — ED Notes (Signed)
Pt transported to CT ?

## 2017-05-19 NOTE — ED Notes (Signed)
Chem 8 not crossing over... EDP aware K 4.1

## 2017-05-19 NOTE — ED Provider Notes (Signed)
[facility]-EMERGENCY DEPT Provider Note   CSN: [id number] Arrival date & time: 05/19/17  0009  By signing my name below, I, Freida Busmaniana Omoyeni, attest that this documentation has been prepared under the direction and in the presence of Pollina, Canary Brimhristopher J, MD . Electronically Signed: Freida Busmaniana Omoyeni, Scribe. 05/19/2017. 12:30 AM.   History   Chief Complaint Chief Complaint  Patient presents with  . Seizures   LEVEL 5 CAVEAT DUE TO ACUITY OF CONDITION  The history is provided by the EMS personnel. No language interpreter was used.     HPI Comments:  Tyler Webb is a 36 y.o. male with a history of seizure  who presents to the Emergency Department via EMS,  s/p seizure this evening. Per EMS, pt lives with his mother who heard a commotion and found the pt in his room. Length of episode is unknown. Pt was post-ictal upon EMS arrival and they reports laceration to the top left of his head. He was given 5mg  Versed IM en route and had an 18 guage IV placed in the left AC.    Past Medical History:  Diagnosis Date  . Hypertension   . Renal disorder    chronic kidney disease - stage 2  . Seizures [facility](HCC)     Patient Active Problem List   Diagnosis Date Noted  . Hypertension 07/14/2016  . Seizure disorder (HCC) 07/14/2016  . Obesity 07/14/2016    History reviewed. No pertinent surgical history.     Home Medications    Prior to Admission medications   Medication Sig Start Date End Date Taking? Authorizing Provider  levETIRAcetam (KEPPRA) 750 MG tablet Take 1,500 mg by mouth 2 (two) times daily.    [provider]    Family History Family History  Problem Relation Age of Onset  . Diabetes Mother   . Kidney failure Mother        Dialysis  . Cancer Father     Social History Social History  Substance Use Topics  . Smoking status: Current Every Day Smoker    Packs/day: 0.50    Types: Cigarettes  . Smokeless tobacco: Never Used  . Alcohol use Yes     Comment: occ      Allergies   Patient has no known allergies.   Review of Systems Review of Systems  Unable to perform ROS: Acuity of condition    Physical Exam Updated Vital Signs BP (!) 130/97   Pulse 67   Temp 98.2 F (36.8 C) (Temporal)   Resp 20   Ht 6\' 2"  (1.88 m)   Wt 113.4 kg (250 lb)   SpO2 100%   BMI 32.10 kg/m   Physical Exam  Constitutional: He appears well-developed and well-nourished. No distress.  HENT:  Head: Normocephalic.  Right Ear: Hearing normal.  Left Ear: Hearing normal.  Nose: Nose normal.  Mouth/Throat: Oropharynx is clear and moist and mucous membranes are normal.  1.5cm abrasion/contusion occip/parietal scalp  Eyes: Conjunctivae and EOM are normal. Pupils are equal, round, and reactive to light.  Neck: Normal range of motion. Neck supple.  Cardiovascular: Regular rhythm, S1 normal and S2 normal.  Exam reveals no gallop and no friction rub.   No murmur heard. Pulmonary/Chest: Effort normal and breath sounds normal. No respiratory distress. He exhibits no tenderness.  Abdominal: Soft. Normal appearance and bowel sounds are normal. There is no hepatosplenomegaly. There is no tenderness. There is no rebound, no guarding, no tenderness at McBurney's point and negative Murphy's sign. No  hernia.  Musculoskeletal: Normal range of motion.  Neurological: He has normal strength. No cranial nerve deficit or sensory deficit.  Skin: Skin is warm, dry and intact. No rash noted. No cyanosis.  Psychiatric: His speech is normal.  Nursing note and vitals reviewed.    ED Treatments / Results  DIAGNOSTIC STUDIES:  Oxygen Saturation is 96% on RA, normal by my interpretation.    Labs (all labs ordered are listed, but only abnormal results are displayed) Labs Reviewed  CBC - Abnormal; Notable for the following:       Result Value   WBC 11.7 (*)    All other components within normal limits  BASIC METABOLIC PANEL - Abnormal; Notable for the following:    Potassium 6.0  (*)    CO2 19 (*)    Glucose, Bld 122 (*)    Creatinine, Ser 1.81 (*)    GFR calc non Af Amer 46 (*)    GFR calc Af Amer 53 (*)    All other components within normal limits  RAPID URINE DRUG SCREEN, HOSP PERFORMED    EKG  EKG Interpretation None       Radiology Ct Head Wo Contrast  Result Date: 05/19/2017 CLINICAL DATA:  Status post unwitnessed seizure and fall. Concern for head or cervical spine injury. Initial encounter. EXAM: CT HEAD WITHOUT CONTRAST CT CERVICAL SPINE WITHOUT CONTRAST TECHNIQUE: Multidetector CT imaging of the head and cervical spine was performed following the standard protocol without intravenous contrast. Multiplanar CT image reconstructions of the cervical spine were also generated. COMPARISON:  CT of the head performed 02/02/2017, and CT of the cervical spine performed 11/17/2016 FINDINGS: CT HEAD FINDINGS Brain: No evidence of acute infarction, hemorrhage, hydrocephalus, extra-axial collection or mass lesion/mass effect. The posterior fossa, including the cerebellum, brainstem and fourth ventricle, is within normal limits. The third and lateral ventricles, and basal ganglia are unremarkable in appearance. The cerebral hemispheres are symmetric in appearance, with normal gray-white differentiation. No mass effect or midline shift is seen. Vascular: No hyperdense vessel or unexpected calcification. Skull: There is no evidence of fracture; visualized osseous structures are unremarkable in appearance. Sinuses/Orbits: The visualized portions of the orbits are within normal limits. The paranasal sinuses and mastoid air cells are well-aerated. Other: Minimal soft tissue injury is suggested at the left vertex. CT CERVICAL SPINE FINDINGS Alignment: Normal. Skull base and vertebrae: No acute fracture. No primary bone lesion or focal pathologic process. Soft tissues and spinal canal: No prevertebral fluid or swelling. No visible canal hematoma. Disc levels: Intervertebral disc  spaces are preserved. The bony foramina are grossly unremarkable in appearance. Upper chest: The visualized lung apices are clear. The thyroid gland is unremarkable. Other: No additional soft tissue abnormalities are seen. IMPRESSION: 1. No evidence of traumatic intracranial injury or fracture. 2. No evidence of fracture or subluxation along the cervical spine. 3. Minimal soft tissue injury suggested at the left vertex. Electronically Signed   By: Roanna Raider M.D.   On: 05/19/2017 01:52   Ct Cervical Spine Wo Contrast  Result Date: 05/19/2017 CLINICAL DATA:  Status post unwitnessed seizure and fall. Concern for head or cervical spine injury. Initial encounter. EXAM: CT HEAD WITHOUT CONTRAST CT CERVICAL SPINE WITHOUT CONTRAST TECHNIQUE: Multidetector CT imaging of the head and cervical spine was performed following the standard protocol without intravenous contrast. Multiplanar CT image reconstructions of the cervical spine were also generated. COMPARISON:  CT of the head performed 02/02/2017, and CT of the cervical spine performed 11/17/2016 FINDINGS:  CT HEAD FINDINGS Brain: No evidence of acute infarction, hemorrhage, hydrocephalus, extra-axial collection or mass lesion/mass effect. The posterior fossa, including the cerebellum, brainstem and fourth ventricle, is within normal limits. The third and lateral ventricles, and basal ganglia are unremarkable in appearance. The cerebral hemispheres are symmetric in appearance, with normal gray-white differentiation. No mass effect or midline shift is seen. Vascular: No hyperdense vessel or unexpected calcification. Skull: There is no evidence of fracture; visualized osseous structures are unremarkable in appearance. Sinuses/Orbits: The visualized portions of the orbits are within normal limits. The paranasal sinuses and mastoid air cells are well-aerated. Other: Minimal soft tissue injury is suggested at the left vertex. CT CERVICAL SPINE FINDINGS Alignment:  Normal. Skull base and vertebrae: No acute fracture. No primary bone lesion or focal pathologic process. Soft tissues and spinal canal: No prevertebral fluid or swelling. No visible canal hematoma. Disc levels: Intervertebral disc spaces are preserved. The bony foramina are grossly unremarkable in appearance. Upper chest: The visualized lung apices are clear. The thyroid gland is unremarkable. Other: No additional soft tissue abnormalities are seen. IMPRESSION: 1. No evidence of traumatic intracranial injury or fracture. 2. No evidence of fracture or subluxation along the cervical spine. 3. Minimal soft tissue injury suggested at the left vertex. Electronically Signed   By: Roanna Raider M.D.   On: 05/19/2017 01:52    Procedures Procedures (including critical care time)  Medications Ordered in ED Medications  levETIRAcetam (KEPPRA) 1,500 mg in sodium chloride 0.9 % 100 mL IVPB (0 mg Intravenous Stopped 05/19/17 0105)     Initial Impression / Assessment and Plan / ED Course  I have reviewed the triage vital signs and the nursing notes.  Pertinent labs & imaging results that were available during my care of the patient were reviewed by me and considered in my medical decision making (see chart for details).     Patient with previous seizure disorder presents to the ER after a seizure. Patient did have a small laceration on the right posterior scalp. This did not require repair. Patient takes Keppra, was given additional IV Keppra here in the ER. He had received Versed prior to arrival by EMS. There was a prolonged postictal and sedated state during which he was monitored. He has not had any further seizure-like activity. CT head and cervical spine unremarkable. Labs unremarkable.  Final Clinical Impressions(s) / ED Diagnoses   Final diagnoses:  Seizure [facility])    New Prescriptions New Prescriptions   No medications on file   I personally performed the services described in this  documentation, which was scribed in my presence. The recorded information has been reviewed and is accurate.     Gilda Crease, MD 05/19/17 [phone]

## 2017-09-17 LAB — GLUCOSE, POCT (MANUAL RESULT ENTRY): POC GLUCOSE: 97 mg/dL (ref 70–99)

## 2018-10-04 ENCOUNTER — Ambulatory Visit: Payer: Self-pay | Admitting: Family Medicine

## 2019-07-06 ENCOUNTER — Other Ambulatory Visit: Payer: Self-pay

## 2019-07-06 DIAGNOSIS — Z20822 Contact with and (suspected) exposure to covid-19: Secondary | ICD-10-CM

## 2019-07-12 LAB — NOVEL CORONAVIRUS, NAA: SARS-CoV-2, NAA: NOT DETECTED

## 2019-07-20 ENCOUNTER — Other Ambulatory Visit: Payer: Self-pay

## 2019-07-20 DIAGNOSIS — Z20822 Contact with and (suspected) exposure to covid-19: Secondary | ICD-10-CM

## 2019-07-23 LAB — NOVEL CORONAVIRUS, NAA: SARS-CoV-2, NAA: NOT DETECTED

## 2019-09-21 ENCOUNTER — Other Ambulatory Visit: Payer: Self-pay

## 2019-09-21 DIAGNOSIS — Z20822 Contact with and (suspected) exposure to covid-19: Secondary | ICD-10-CM

## 2019-09-22 LAB — NOVEL CORONAVIRUS, NAA: SARS-CoV-2, NAA: NOT DETECTED

## 2019-11-16 ENCOUNTER — Other Ambulatory Visit: Payer: Self-pay

## 2019-11-16 DIAGNOSIS — Z20822 Contact with and (suspected) exposure to covid-19: Secondary | ICD-10-CM

## 2019-11-19 LAB — NOVEL CORONAVIRUS, NAA: SARS-CoV-2, NAA: NOT DETECTED

## 2019-12-13 ENCOUNTER — Other Ambulatory Visit: Payer: Self-pay | Admitting: *Deleted

## 2019-12-13 DIAGNOSIS — Z20822 Contact with and (suspected) exposure to covid-19: Secondary | ICD-10-CM

## 2019-12-15 LAB — NOVEL CORONAVIRUS, NAA: SARS-CoV-2, NAA: NOT DETECTED

## 2019-12-17 ENCOUNTER — Other Ambulatory Visit: Payer: Self-pay

## 2019-12-17 DIAGNOSIS — Z20822 Contact with and (suspected) exposure to covid-19: Secondary | ICD-10-CM

## 2019-12-18 LAB — NOVEL CORONAVIRUS, NAA: SARS-CoV-2, NAA: NOT DETECTED

## 2020-01-11 ENCOUNTER — Other Ambulatory Visit: Payer: Self-pay | Admitting: Critical Care Medicine

## 2020-01-11 DIAGNOSIS — Z20822 Contact with and (suspected) exposure to covid-19: Secondary | ICD-10-CM

## 2020-01-12 LAB — NOVEL CORONAVIRUS, NAA: SARS-CoV-2, NAA: NOT DETECTED

## 2020-02-01 ENCOUNTER — Other Ambulatory Visit: Payer: Self-pay

## 2020-02-01 DIAGNOSIS — Z20822 Contact with and (suspected) exposure to covid-19: Secondary | ICD-10-CM

## 2020-02-03 LAB — NOVEL CORONAVIRUS, NAA: SARS-CoV-2, NAA: NOT DETECTED

## 2020-03-07 ENCOUNTER — Other Ambulatory Visit: Payer: Self-pay

## 2020-03-07 DIAGNOSIS — Z20822 Contact with and (suspected) exposure to covid-19: Secondary | ICD-10-CM

## 2020-03-08 LAB — NOVEL CORONAVIRUS, NAA: SARS-CoV-2, NAA: NOT DETECTED

## 2021-10-12 ENCOUNTER — Emergency Department (HOSPITAL_COMMUNITY)
Admission: EM | Admit: 2021-10-12 | Discharge: 2021-10-13 | Disposition: A | Discharge status: Home / Self Care | Payer: BC Managed Care – PPO | Attending: Emergency Medicine | Admitting: Emergency Medicine

## 2021-10-12 ENCOUNTER — Emergency Department (HOSPITAL_COMMUNITY): Payer: BC Managed Care – PPO

## 2021-10-12 ENCOUNTER — Other Ambulatory Visit: Payer: Self-pay

## 2021-10-12 DIAGNOSIS — I129 Hypertensive chronic kidney disease with stage 1 through stage 4 chronic kidney disease, or unspecified chronic kidney disease: Secondary | ICD-10-CM | POA: Diagnosis not present

## 2021-10-12 DIAGNOSIS — R1032 Left lower quadrant pain: Secondary | ICD-10-CM | POA: Insufficient documentation

## 2021-10-12 DIAGNOSIS — N182 Chronic kidney disease, stage 2 (mild): Secondary | ICD-10-CM | POA: Insufficient documentation

## 2021-10-12 DIAGNOSIS — R109 Unspecified abdominal pain: Secondary | ICD-10-CM

## 2021-10-12 LAB — CBC
HCT: 47.7 % (ref 39.0–52.0)
Hemoglobin: 15.7 g/dL (ref 13.0–17.0)
MCH: 29.6 pg (ref 26.0–34.0)
MCHC: 32.9 g/dL (ref 30.0–36.0)
MCV: 89.8 fL (ref 80.0–100.0)
Platelets: 244 10*3/uL (ref 150–400)
RBC: 5.31 MIL/uL (ref 4.22–5.81)
RDW: 13.8 % (ref 11.5–15.5)
WBC: 12.6 10*3/uL — ABNORMAL HIGH (ref 4.0–10.5)
nRBC: 0 % (ref 0.0–0.2)

## 2021-10-12 LAB — COMPREHENSIVE METABOLIC PANEL
ALT: 29 U/L (ref 0–44)
AST: 34 U/L (ref 15–41)
Albumin: 4.1 g/dL (ref 3.5–5.0)
Alkaline Phosphatase: 42 U/L (ref 38–126)
Anion gap: 12 (ref 5–15)
BUN: 13 mg/dL (ref 6–20)
CO2: 22 mmol/L (ref 22–32)
Calcium: 9.5 mg/dL (ref 8.9–10.3)
Chloride: 102 mmol/L (ref 98–111)
Creatinine, Ser: 1.57 mg/dL — ABNORMAL HIGH (ref 0.61–1.24)
GFR, Estimated: 56 mL/min — ABNORMAL LOW (ref >60)
Glucose, Bld: 128 mg/dL — ABNORMAL HIGH (ref 70–99)
Potassium: 3.8 mmol/L (ref 3.5–5.1)
Sodium: 136 mmol/L (ref 135–145)
Total Bilirubin: 1.1 mg/dL (ref 0.3–1.2)
Total Protein: 7.7 g/dL (ref 6.5–8.1)

## 2021-10-12 LAB — LIPASE, BLOOD: Lipase: 146 U/L — ABNORMAL HIGH (ref 11–51)

## 2021-10-12 MED ORDER — DICYCLOMINE HCL 10 MG PO CAPS
10.0000 mg | ORAL_CAPSULE | Freq: Once | ORAL | Status: AC
  Administered 2021-10-12: 10 mg via ORAL
  Filled 2021-10-12: qty 1

## 2021-10-12 MED ORDER — IOHEXOL 300 MG/ML  SOLN
80.0000 mL | Freq: Once | INTRAMUSCULAR | Status: AC | PRN
  Administered 2021-10-12: 80 mL via INTRAVENOUS

## 2021-10-12 MED ORDER — OXYCODONE-ACETAMINOPHEN 5-325 MG PO TABS
1.0000 | ORAL_TABLET | Freq: Once | ORAL | Status: AC
  Administered 2021-10-12: 1 via ORAL
  Filled 2021-10-12: qty 1

## 2021-10-12 MED ORDER — ACETAMINOPHEN 325 MG PO TABS
650.0000 mg | ORAL_TABLET | Freq: Once | ORAL | Status: AC
  Administered 2021-10-12: 650 mg via ORAL
  Filled 2021-10-12: qty 2

## 2021-10-12 NOTE — ED Notes (Signed)
Pt noted to be yelling at staff in the waiting room causing a scene and being hostile with tone of voice. Explained to patient that verbal abuse will not be tolerated and patient will be asked to leave if escalation of behavior continues. Pt medicated with bentyl per provider orders. Pt NAD at present but angry with wait times. Notified security and charge RN of situation.

## 2021-10-12 NOTE — ED Provider Notes (Signed)
Emergency Medicine Provider Triage Evaluation Note  Tyler Webb , a 40 y.o. male  was evaluated in triage.  Pt complains of abdominal pain, diarrhea since 3a this morning. Patient reports the pain is 10/10. Patient denies NV, recent food ingestion changes, dysuria. Patient denies similar pain in the past.  Review of Systems  Positive: Abdominal pain, diarrhea Negative: NV, dysuria  Physical Exam  BP (!) 162/132 (BP Location: Right Arm)   Pulse 84   Temp 98.4 F (36.9 C) (Oral)   Resp 18   SpO2 100%  Gen:   Awake, distressed, agitated Resp:  Normal effort  MSK:   Moves extremities without difficulty  Other:  LLQ pain, no rebound, rigidity, guarding  Medical Decision Making  Medically screening exam initiated at 11:16 AM.  Appropriate orders placed.  Tyler Webb was informed that the remainder of the evaluation will be completed by another provider, this initial triage assessment does not replace that evaluation, and the importance of remaining in the ED until their evaluation is complete.  Abdominal pain, diarrhea   Tyler Webb 10/12/21 1118    Tyler Bale, MD 10/12/21 2033

## 2021-10-12 NOTE — ED Triage Notes (Signed)
Pt. Stated, I started having stomach pain this morning and hasnt stopped. No other symptoms

## 2021-10-13 MED ORDER — LIDOCAINE VISCOUS HCL 2 % MT SOLN
15.0000 mL | Freq: Once | OROMUCOSAL | Status: AC
  Administered 2021-10-13: 15 mL via ORAL
  Filled 2021-10-13: qty 15

## 2021-10-13 MED ORDER — PANTOPRAZOLE SODIUM 40 MG IV SOLR
40.0000 mg | Freq: Once | INTRAVENOUS | Status: AC
  Administered 2021-10-13: 40 mg via INTRAVENOUS
  Filled 2021-10-13: qty 40

## 2021-10-13 MED ORDER — SODIUM CHLORIDE 0.9 % IV BOLUS
1000.0000 mL | Freq: Once | INTRAVENOUS | Status: AC
  Administered 2021-10-13: 1000 mL via INTRAVENOUS

## 2021-10-13 MED ORDER — HYDROMORPHONE HCL 1 MG/ML IJ SOLN
1.0000 mg | Freq: Once | INTRAMUSCULAR | Status: AC
  Administered 2021-10-13: 1 mg via INTRAVENOUS
  Filled 2021-10-13: qty 1

## 2021-10-13 MED ORDER — ALUM & MAG HYDROXIDE-SIMETH 200-200-20 MG/5ML PO SUSP
30.0000 mL | Freq: Once | ORAL | Status: AC
  Administered 2021-10-13: 30 mL via ORAL
  Filled 2021-10-13: qty 30
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
## 2021-11-12 ENCOUNTER — Encounter (HOSPITAL_COMMUNITY): Payer: Self-pay | Admitting: Radiology
